# Patient Record
Sex: Female | Born: 1950 | Race: White | Hispanic: No | Smoking: Never smoker
Health system: Southern US, Community
[De-identification: ages and names within clinical notes are randomized; demographics above are authoritative.]

## PROBLEM LIST (undated history)

## (undated) DIAGNOSIS — I5032 Chronic diastolic (congestive) heart failure: Secondary | ICD-10-CM

## (undated) DIAGNOSIS — I10 Essential (primary) hypertension: Secondary | ICD-10-CM

## (undated) DIAGNOSIS — K589 Irritable bowel syndrome without diarrhea: Secondary | ICD-10-CM

## (undated) DIAGNOSIS — E7800 Pure hypercholesterolemia, unspecified: Secondary | ICD-10-CM

## (undated) DIAGNOSIS — F329 Major depressive disorder, single episode, unspecified: Secondary | ICD-10-CM

## (undated) DIAGNOSIS — M1990 Unspecified osteoarthritis, unspecified site: Secondary | ICD-10-CM

## (undated) DIAGNOSIS — N950 Postmenopausal bleeding: Secondary | ICD-10-CM

## (undated) DIAGNOSIS — H35033 Hypertensive retinopathy, bilateral: Secondary | ICD-10-CM

## (undated) DIAGNOSIS — E119 Type 2 diabetes mellitus without complications: Secondary | ICD-10-CM

## (undated) DIAGNOSIS — R9389 Abnormal findings on diagnostic imaging of other specified body structures: Secondary | ICD-10-CM

## (undated) DIAGNOSIS — N186 End stage renal disease: Secondary | ICD-10-CM

## (undated) DIAGNOSIS — I639 Cerebral infarction, unspecified: Secondary | ICD-10-CM

## (undated) DIAGNOSIS — N184 Chronic kidney disease, stage 4 (severe): Secondary | ICD-10-CM

## (undated) DIAGNOSIS — G629 Polyneuropathy, unspecified: Secondary | ICD-10-CM

## (undated) DIAGNOSIS — R569 Unspecified convulsions: Secondary | ICD-10-CM

## (undated) DIAGNOSIS — M797 Fibromyalgia: Secondary | ICD-10-CM

## (undated) DIAGNOSIS — Z87442 Personal history of urinary calculi: Secondary | ICD-10-CM

## (undated) DIAGNOSIS — D500 Iron deficiency anemia secondary to blood loss (chronic): Secondary | ICD-10-CM

## (undated) DIAGNOSIS — Z7901 Long term (current) use of anticoagulants: Secondary | ICD-10-CM

## (undated) DIAGNOSIS — G4733 Obstructive sleep apnea (adult) (pediatric): Secondary | ICD-10-CM

## (undated) HISTORY — PX: TUBAL LIGATION: SHX77

## (undated) HISTORY — PX: RECONSTRUCTION OF NOSE: SHX2301

---

## 1972-06-20 HISTORY — PX: RHINOPLASTY: SUR1284

## 1990-06-20 HISTORY — PX: WRIST FRACTURE SURGERY: SHX121

## 1998-03-14 ENCOUNTER — Observation Stay (HOSPITAL_COMMUNITY): Admission: EM | Admit: 1998-03-14 | Discharge: 1998-03-14 | Payer: Self-pay | Admitting: Emergency Medicine

## 1998-03-14 ENCOUNTER — Encounter: Payer: Self-pay | Admitting: Emergency Medicine

## 2001-03-07 ENCOUNTER — Encounter: Admission: RE | Admit: 2001-03-07 | Discharge: 2001-06-05 | Payer: Self-pay | Admitting: Family Medicine

## 2001-06-20 HISTORY — PX: LAPAROSCOPIC CHOLECYSTECTOMY: SUR755

## 2002-01-02 ENCOUNTER — Encounter: Admission: RE | Admit: 2002-01-02 | Discharge: 2002-04-02 | Payer: Self-pay | Admitting: Obstetrics and Gynecology

## 2002-03-29 ENCOUNTER — Encounter: Payer: Self-pay | Admitting: Emergency Medicine

## 2002-03-29 ENCOUNTER — Emergency Department (HOSPITAL_COMMUNITY): Admission: EM | Admit: 2002-03-29 | Discharge: 2002-03-29 | Payer: Self-pay | Admitting: Emergency Medicine

## 2002-04-18 ENCOUNTER — Ambulatory Visit (HOSPITAL_COMMUNITY): Admission: RE | Admit: 2002-04-18 | Discharge: 2002-04-19 | Payer: Self-pay | Admitting: General Surgery

## 2002-04-18 ENCOUNTER — Encounter: Payer: Self-pay | Admitting: General Surgery

## 2002-04-18 HISTORY — PX: LAPAROSCOPIC CHOLECYSTECTOMY: SUR755

## 2002-04-19 ENCOUNTER — Encounter (INDEPENDENT_AMBULATORY_CARE_PROVIDER_SITE_OTHER): Payer: Self-pay | Admitting: *Deleted

## 2003-12-01 ENCOUNTER — Inpatient Hospital Stay (HOSPITAL_BASED_OUTPATIENT_CLINIC_OR_DEPARTMENT_OTHER): Admission: RE | Admit: 2003-12-01 | Discharge: 2003-12-01 | Payer: Self-pay | Admitting: Cardiology

## 2003-12-01 HISTORY — PX: CARDIAC CATHETERIZATION: SHX172

## 2004-05-02 ENCOUNTER — Emergency Department (HOSPITAL_COMMUNITY): Admission: EM | Admit: 2004-05-02 | Discharge: 2004-05-03 | Payer: Self-pay | Admitting: Emergency Medicine

## 2004-06-16 ENCOUNTER — Emergency Department (HOSPITAL_COMMUNITY): Admission: EM | Admit: 2004-06-16 | Discharge: 2004-06-16 | Payer: Self-pay

## 2004-07-03 ENCOUNTER — Emergency Department (HOSPITAL_COMMUNITY): Admission: EM | Admit: 2004-07-03 | Discharge: 2004-07-03 | Payer: Self-pay | Admitting: Emergency Medicine

## 2005-06-09 ENCOUNTER — Emergency Department (HOSPITAL_COMMUNITY): Admission: EM | Admit: 2005-06-09 | Discharge: 2005-06-09 | Payer: Self-pay | Admitting: Emergency Medicine

## 2006-12-19 DIAGNOSIS — Z87898 Personal history of other specified conditions: Secondary | ICD-10-CM

## 2006-12-19 HISTORY — DX: Personal history of other specified conditions: Z87.898

## 2007-01-05 ENCOUNTER — Emergency Department (HOSPITAL_COMMUNITY): Admission: EM | Admit: 2007-01-05 | Discharge: 2007-01-05 | Payer: Self-pay | Admitting: Emergency Medicine

## 2007-11-28 ENCOUNTER — Emergency Department (HOSPITAL_COMMUNITY): Admission: EM | Admit: 2007-11-28 | Discharge: 2007-11-28 | Payer: Self-pay | Admitting: Emergency Medicine

## 2007-12-24 ENCOUNTER — Encounter: Admission: RE | Admit: 2007-12-24 | Discharge: 2007-12-24 | Payer: Self-pay | Admitting: Internal Medicine

## 2010-11-05 NOTE — H&P (Signed)
NAME:  Dorothy Harmon, Dorothy Harmon NO.:  [id number]   MEDICAL RECORD NO.:  [id number]                   PATIENT TYPE:  AMB   LOCATION:                                       FACILITY:  [facility]   PHYSICIAN:  Peter M. Martinique, M.D.               DATE OF BIRTH:  1950/10/03   DATE OF ADMISSION:  12/01/2003  DATE OF DISCHARGE:                                HISTORY & PHYSICAL   HISTORY OF PRESENT ILLNESS:  Dorothy Harmon is a pleasant 60 year old white  female with a history of morbid obesity, diabetes mellitus, hypertension,  hyperlipidemia, and family history of coronary disease who is seen for  evaluation of progressive dyspnea and chest pain.  The patient states she  has a very sedentary lifestyle.  However, recently she has developed  progressive symptoms of shortness of breath with any activity.  She states  she has to sit down and gasp for breath.  She has noticed it more since she  has returned to work part time in [employer].  She states her whole  anterior chest feels like it is squeezing and heavy and has some associated  palpitations.  She states she is always tired and has no energy.  She has  noticed some increased swelling in her lower extremities.  She has to sleep  sitting up in a chair.  She does carry diagnosis of sleep apnea. She states  she does not use CPAP therapy because it just did not seem to help.  To  further evaluate her symptoms, she underwent an adenosine Cardiolite study  on December 21, 2003.  This was a nondiagnostic study.  It demonstrated a  moderate fixed defect involving the anterior wall.  In the vertical axis  views there was a reversible defect at the apex.  A lot of the anterior wall  defect was felt to be related to breast attenuation artifact but ischemia  could not be excluded and/or scar.  Because of her progressive symptoms,  multiple cardiac risk factors, and nondiagnostic, noninvasive evaluation, it  was recommended that she undergo  cardiac catheterization.   PAST MEDICAL HISTORY:  1. Morbid obesity.  2. Obstructive sleep apnea.  3. Hypertension.  4. Hyperlipidemia.  5. Diabetes mellitus, insulin requiring.  6. Irritable bowel syndrome.  7. Fibromyalgia.  8. Status post cholecystectomy.  9. Status post tubal ligation.   ALLERGIES:  1. PENICILLIN.  2. CODEINE.   CURRENT MEDICATIONS:  1. Lantus insulin 15 units q.h.s.  2. Amaryl 4 mg per day.  3. Aspirin 325 mg per day.  4. She is also on the following medications at unknown doses:  Avapro daily,     Actos daily, Lipitor daily, and Norvasc daily.  5. She takes a fish oil and vitamin B supplement daily.   SOCIAL HISTORY:  The patient works part time in a [employer].  She does  not exercise.  She denies tobacco or alcohol use.  She is divorced and has  two children.   FAMILY HISTORY:  Father died at age 60 of myocardial infarction.  Mother is  age 2 and has diabetes.  She has no siblings.   REVIEW OF SYMPTOMS:  As noted in HPI.  She has no recent change in bowel  habits or kidney function.  No history of renal disease.  No history of  retinopathy.  No history of TIA, CVA or claudication.  Other review of  systems are negative.   PHYSICAL EXAMINATION:  GENERAL APPEARANCE:  The patient is a morbidly obese  white female in no distress.  VITAL SIGNS:  Weight 309, blood pressure 140/88, resting pulse 100,  respirations are normal.  HEENT:  Pupils are equal, round, reactive to light and accommodation.  Extraocular movements are intact.  Oropharynx is clear.  NECK:  Thick without JVD, adenopathy or bruits.  LUNGS:  Clear to auscultation and percussion.  BREASTS:  Large and pendulous.  CARDIOVASCULAR:  Regular rate and rhythm without gallops, murmurs, rubs or  clicks.  ABDOMEN:  Morbidly obese, soft and nontender.  There are no masses or  bruits.  EXTREMITIES:  There is 1 to 2+ edema to the mid calf.  There is no cyanosis,  pulses 2+ and symmetric.   NEUROLOGIC:  Nonfocal.   LABORATORY DATA:  ECG shows normal sinus rhythm with a normal ECG.   Chest x-ray is poorly penetrated.  There is question of a left lower lobe  infiltrate versus atelectasis.  Coags are normal.  Glucose 107, BUN 17,  creatinine 0.7, sodium 142, potassium 4.1, chloride 104, CO2 29, white count  5900, hemoglobin 13.6, platelets 296,000. BNP level was 22.   IMPRESSION:  1. Symptoms of progressive dyspnea, chest heaviness and tightness.     Noninvasive evaluation with adenosine Cardiolite study that is     nondiagnostic.  The patient has multiple cardiac risk factors.  Need to     consider ischemic heart disease.  Differential also includes pulmonary     hypertension because of her morbid obesity and history of sleep apnea.  2. Diabetes mellitus, insulin requiring.  3. Hypertension.  4. Hyperlipidemia.  5. Morbid obesity.   PLAN:  Will proceed with right and left heart catheterization coronary  angiography.                                                Peter M. Martinique, M.D.    PMJ/MEDQ  D:  11/27/2003  T:  11/28/2003  Job:  [id number]   cc:   Haywood Pao, M.D.  [address]  [facility]  [place] [address]  Fax: [phone]

## 2010-11-05 NOTE — Op Note (Signed)
NAME:  Dorothy Harmon, Dorothy Harmon NO.:  [id number]   MEDICAL RECORD NO.:  [id number]                   PATIENT TYPE:  OIB   LOCATION:  5705                                 FACILITY:  [facility]   PHYSICIAN:  Odis Hollingshead, M.D.            DATE OF BIRTH:  [birth year]   DATE OF PROCEDURE:  04/18/2002  DATE OF DISCHARGE:                                 OPERATIVE REPORT   PREOPERATIVE DIAGNOSIS:  Symptomatic cholelithiasis.   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   PROCEDURE:  Laparoscopic cholecystectomy with intraoperative cholangiogram.   SURGEON:  Odis Hollingshead, M.D.   ASSISTANT:  Haywood Lasso, M.D.   ANESTHESIA:  General.   INDICATIONS:  The patient is a 60 year old female who has type 2 diabetes.  She had a severe case of biliary colic.  She presented to the [facility]  emergency department and was discovered to have a 2.1 cm gallstone with no  biliary ductal dilatation, no evidence of acute cholecystitis.  She was seen  in the office and now presents for elective laparoscopic cholecystectomy.  The procedure and the risks were discussed with her preoperatively.   DESCRIPTION OF PROCEDURE:  She was brought to the operating room, placed  supine on the operating table, and a general anesthetic was administered.  Her abdomen was sterilely prepped and draped.  Dilute Marcaine was  infiltrated in the skin of the subumbilical region and a small subumbilical  incision was made through the skin and subcutaneous tissue.  The midline  fascia was identified and a small incision made in the midline fascia.  A  pursestring suture of 0 Vicryl was placed around the fascial edges.  The  peritoneal cavity was then entered bluntly under direct vision.  A Hasson  trocar was introduced into the peritoneal cavity, and pneumoperitoneum was  created by insufflation of CO2 gas.  Next she was placed in the  reverse  Trendelenburg position and rotated slightly right-side-up.  The laparoscope  was introduced and some fatty infiltration of the liver was noted.  Under  direct vision, an 11 mm trocar was placed through a similar-sized epigastric  incision into the peritoneal cavity and two 5 mm trocars were placed in the  right midabdomen through small incisions into the peritoneal cavity.  The  fundus of the gallbladder was then grasped and retracted toward the right  shoulder.  The infundibulum was then grasped and using blunt dissection and  a select cautery and staying close to the gallbladder, the infundibulum was  mobilized.  The cystic duct was identified and a window created around it.  A clip was placed at the cystic duct-gallbladder junction and a small  incision made in the cystic duct.  A cholangiocatheter was passed through  the anterior abdominal wall, and a cholangiogram was then performed.   Under real-time fluoroscopy, dilute contrast  material was injected to the  cystic duct.  The cystic duct was of moderate length.  The common hepatic,  right and left hepatic, and common bile ducts all filled promptly and  contrast splashed into the duodenum promptly.  There was no obvious evidence  of obstructive lesions.  The final report is pending the radiologist's  interpretation.   The cholangiocatheter was removed.  The cystic duct was then clipped three  times proximally and divided.  The cystic duct was identified, clipped, and  divided.  The gallbladder was dissected free from the liver bed intact with  the cautery.  It was placed in an Endopouch bag.   The gallbladder fossa was irrigated, inspected, and bleeding was controlled  with the cautery.  When hemostasis was adequate, the area was inspected  again and no bleeding or bile leak was noted.  The irrigation fluid was then  evacuated.   The gallbladder was then removed in the bag through the subumbilical port.  Under direct  laparoscopic vision, the fascial defect was closed by  tightening up and tying down the pursestring suture.  The remaining trocars  were removed and the pneumoperitoneum was released.  The skin incisions were  closed with 4-0 Monocryl subcuticular stitches.  Steri-Strips and sterile  dressings were applied.   She tolerated the procedure well without any apparent complications and was  taken to the recovery room in satisfactory condition.                                               Odis Hollingshead, M.D.    Katina Degree  D:  04/18/2002  T:  04/19/2002  Job:  [id number]   cc:   Haywood Pao, M.D.

## 2010-11-05 NOTE — Cardiovascular Report (Signed)
NAME:  Dorothy Harmon, Dorothy Harmon NO.:  [id number]   MEDICAL RECORD NO.:  [id number]                   PATIENT TYPE:  OIB   LOCATION:  6501                                 FACILITY:  [facility]   PHYSICIAN:  Peter M. Martinique, M.D.               DATE OF BIRTH:  30-Apr-1951   DATE OF PROCEDURE:  12/01/2003  DATE OF DISCHARGE:  12/01/2003                              CARDIAC CATHETERIZATION   </PROCEDURES>  Right and left heart catheterization, coronary and left ventricular  angiography.   CARDIOLOGIST:  Peter M. Martinique, M.D.   INDICATIONS:  This is a 60 year old white female with a history of morbid  obesity, diabetes mellitus, hypertension, hyperlipidemia, who presents with  symptoms of chest discomfort and shortness of breath on exertion.  Previous  Cardiolite study was nondiagnostic and this demonstrated predominantly fixed  anterior wall defect with some mild reversibility of the apex.   ACCESS:  Via the right femoral artery using the standard Seldinger  technique.   EQUIPMENT:  4 French 4 cm right and left Judkins catheter, 4 French pigtail  catheter, 4 French arterial sheath, 7 French venous sheath and a balloon tip  Swan-Ganz catheter.   CONTRAST:  19mL of Omnipaque.   MEDICATIONS:  Local anesthesia with 1% Xylocaine.   COMPLICATIONS:  None.   HEMODYNAMIC DATA:  Aortic pressure is 136/80 with a mean of 105.  Left  ventricular pressure is 143 with an end-diastolic pressure of 25 mmHg.  Right atrial pressure is 19/13 with a mean of 12 mm of mercury.  Right  ventricular pressure is 42 with an end-diastolic pressure of 16 mmHg.  Pulmonary artery pressure is 41/22 with a mean of 32 mmHg.  Pulmonary  capillary wedge pressure is 24/23 with a mean of 19 mmHg.  Cardiac output by  Fick was 8.7 with an index of 3.7.  There is no evidence of shunt.   ANGIOGRAPHIC DATA:  Left ventricular angiography was performed in the RAO  view.  This demonstrates normal left  ventricular size and contractility with  normal systolic function.  Ejection fraction is estimated at 65%.  There is  no mitral regurgitation or prolapse.   CORONARY ANGIOGRAPHY:  The left coronary artery arises and distributes  normally.  The left main coronary artery is normal.   The left anterior descending artery and its branches are normal.   The left circumflex coronary artery is normal.   The right coronary artery is a large dominant vessel and is normal.   FINAL INTERPRETATION:  1. Normal coronary anatomy.  2. Normal left ventricular function.  3. Mild to moderate pulmonary hypertension.   PLAN:  Recommend continued medical therapy with specific treatment  addressing her sleep apnea and morbid obesity.  Peter M. Martinique, M.D.    PMJ/MEDQ  D:  12/01/2003  T:  12/02/2003  Job:  [id number]   cc:   Haywood Pao, M.D.  [address]  [place]  [place] [address]  Fax: [phone]

## 2011-03-17 LAB — URINE CULTURE

## 2011-03-17 LAB — POCT I-STAT, CHEM 8
BUN: 12
Calcium, Ion: 1.11 — ABNORMAL LOW
Chloride: 105
Creatinine, Ser: 0.9
Glucose, Bld: 187 — ABNORMAL HIGH
HCT: 42
Hemoglobin: 14.3
Potassium: 4
Sodium: 139
TCO2: 28

## 2011-03-17 LAB — HEPATIC FUNCTION PANEL
AST: 16
Albumin: 3.4 — ABNORMAL LOW

## 2011-03-17 LAB — POCT CARDIAC MARKERS
CKMB, poc: <1 — ABNORMAL LOW
Myoglobin, poc: 33.4
Operator id: 277751
Troponin i, poc: <0.05

## 2011-03-17 LAB — URINALYSIS, ROUTINE W REFLEX MICROSCOPIC
Glucose, UA: NEGATIVE
Hgb urine dipstick: NEGATIVE
Specific Gravity, Urine: 1.01
pH: 6

## 2011-03-17 LAB — CBC
MCHC: 34.7
RBC: 4.74

## 2011-03-17 LAB — DIFFERENTIAL
Basophils Absolute: 0
Basophils Relative: 1
Monocytes Relative: 8
Neutro Abs: 3.3
Neutrophils Relative %: 54

## 2011-03-17 LAB — URINE MICROSCOPIC-ADD ON

## 2011-04-04 LAB — URINALYSIS, ROUTINE W REFLEX MICROSCOPIC
Bilirubin Urine: NEGATIVE
Hgb urine dipstick: NEGATIVE
Specific Gravity, Urine: 1.023
pH: 7

## 2011-04-04 LAB — I-STAT 8, (EC8 V) (CONVERTED LAB)
Acid-base deficit: 2
BUN: 17
Bicarbonate: 21.8
Chloride: 109
HCT: 46
Hemoglobin: 15.6 — ABNORMAL HIGH
Operator id: 189501
Sodium: 140

## 2011-04-04 LAB — URINE MICROSCOPIC-ADD ON

## 2011-04-04 LAB — COMPREHENSIVE METABOLIC PANEL
Albumin: 3.7
BUN: 15
Calcium: 8.7
Creatinine, Ser: 0.84
Potassium: 3.4 — ABNORMAL LOW
Total Protein: 6.6

## 2011-04-04 LAB — URINE CULTURE

## 2011-04-04 LAB — CBC
HCT: 42.8
MCHC: 34.5
Platelets: 225
RDW: 13.7

## 2011-04-04 LAB — DIFFERENTIAL
Lymphocytes Relative: 6 — ABNORMAL LOW
Lymphs Abs: 0.8
Monocytes Absolute: 0.6
Monocytes Relative: 4
Neutro Abs: 12.4 — ABNORMAL HIGH
Neutrophils Relative %: 89 — ABNORMAL HIGH

## 2011-04-04 LAB — POCT I-STAT CREATININE: Creatinine, Ser: 1

## 2011-04-04 LAB — RAPID URINE DRUG SCREEN, HOSP PERFORMED
Benzodiazepines: NOT DETECTED
Cocaine: NOT DETECTED
Opiates: NOT DETECTED
Tetrahydrocannabinol: NOT DETECTED

## 2013-04-21 ENCOUNTER — Observation Stay (HOSPITAL_COMMUNITY): Payer: Self-pay

## 2013-04-21 ENCOUNTER — Inpatient Hospital Stay (HOSPITAL_COMMUNITY)
Admission: EM | Admit: 2013-04-21 | Discharge: 2013-04-24 | DRG: 065 | Disposition: A | Discharge status: Home / Self Care | Payer: Self-pay | Attending: Internal Medicine | Admitting: Internal Medicine

## 2013-04-21 ENCOUNTER — Emergency Department (HOSPITAL_COMMUNITY): Payer: Self-pay

## 2013-04-21 ENCOUNTER — Encounter (HOSPITAL_COMMUNITY): Payer: Self-pay | Admitting: Radiology

## 2013-04-21 DIAGNOSIS — R569 Unspecified convulsions: Secondary | ICD-10-CM

## 2013-04-21 DIAGNOSIS — R4789 Other speech disturbances: Secondary | ICD-10-CM

## 2013-04-21 DIAGNOSIS — E1165 Type 2 diabetes mellitus with hyperglycemia: Secondary | ICD-10-CM | POA: Diagnosis present

## 2013-04-21 DIAGNOSIS — H5316 Psychophysical visual disturbances: Secondary | ICD-10-CM | POA: Diagnosis present

## 2013-04-21 DIAGNOSIS — Z8249 Family history of ischemic heart disease and other diseases of the circulatory system: Secondary | ICD-10-CM

## 2013-04-21 DIAGNOSIS — Z8673 Personal history of transient ischemic attack (TIA), and cerebral infarction without residual deficits: Secondary | ICD-10-CM

## 2013-04-21 DIAGNOSIS — I639 Cerebral infarction, unspecified: Secondary | ICD-10-CM | POA: Diagnosis present

## 2013-04-21 DIAGNOSIS — Z880 Allergy status to penicillin: Secondary | ICD-10-CM

## 2013-04-21 DIAGNOSIS — K589 Irritable bowel syndrome without diarrhea: Secondary | ICD-10-CM | POA: Diagnosis present

## 2013-04-21 DIAGNOSIS — E119 Type 2 diabetes mellitus without complications: Secondary | ICD-10-CM

## 2013-04-21 DIAGNOSIS — R413 Other amnesia: Secondary | ICD-10-CM | POA: Diagnosis present

## 2013-04-21 DIAGNOSIS — I6350 Cerebral infarction due to unspecified occlusion or stenosis of unspecified cerebral artery: Principal | ICD-10-CM | POA: Diagnosis present

## 2013-04-21 DIAGNOSIS — Z9089 Acquired absence of other organs: Secondary | ICD-10-CM

## 2013-04-21 DIAGNOSIS — F05 Delirium due to known physiological condition: Secondary | ICD-10-CM | POA: Diagnosis present

## 2013-04-21 DIAGNOSIS — I10 Essential (primary) hypertension: Secondary | ICD-10-CM | POA: Diagnosis present

## 2013-04-21 DIAGNOSIS — G459 Transient cerebral ischemic attack, unspecified: Secondary | ICD-10-CM

## 2013-04-21 DIAGNOSIS — E7800 Pure hypercholesterolemia, unspecified: Secondary | ICD-10-CM | POA: Diagnosis present

## 2013-04-21 DIAGNOSIS — E785 Hyperlipidemia, unspecified: Secondary | ICD-10-CM | POA: Diagnosis present

## 2013-04-21 DIAGNOSIS — Z833 Family history of diabetes mellitus: Secondary | ICD-10-CM

## 2013-04-21 DIAGNOSIS — H539 Unspecified visual disturbance: Secondary | ICD-10-CM

## 2013-04-21 DIAGNOSIS — IMO0001 Reserved for inherently not codable concepts without codable children: Secondary | ICD-10-CM | POA: Diagnosis present

## 2013-04-21 DIAGNOSIS — IMO0002 Reserved for concepts with insufficient information to code with codable children: Secondary | ICD-10-CM | POA: Diagnosis present

## 2013-04-21 HISTORY — DX: Pure hypercholesterolemia, unspecified: E78.00

## 2013-04-21 HISTORY — DX: Unspecified osteoarthritis, unspecified site: M19.90

## 2013-04-21 HISTORY — DX: Unspecified convulsions: R56.9

## 2013-04-21 HISTORY — DX: Type 2 diabetes mellitus without complications: E11.9

## 2013-04-21 HISTORY — DX: Fibromyalgia: M79.7

## 2013-04-21 HISTORY — DX: Personal history of transient ischemic attack (TIA), and cerebral infarction without residual deficits: Z86.73

## 2013-04-21 HISTORY — DX: Essential (primary) hypertension: I10

## 2013-04-21 LAB — CBC
Hemoglobin: 14.5 g/dL (ref 12.0–15.0)
MCH: 30.5 pg (ref 26.0–34.0)
Platelets: 231 10*3/uL (ref 150–400)
RBC: 4.76 MIL/uL (ref 3.87–5.11)
WBC: 6 10*3/uL (ref 4.0–10.5)

## 2013-04-21 LAB — POCT I-STAT TROPONIN I: Troponin i, poc: 0.01 ng/mL (ref 0.00–0.08)

## 2013-04-21 LAB — DIFFERENTIAL
Eosinophils Absolute: 0.2 10*3/uL (ref 0.0–0.7)
Lymphocytes Relative: 34 % (ref 12–46)
Lymphs Abs: 2.1 10*3/uL (ref 0.7–4.0)
Monocytes Relative: 5 % (ref 3–12)
Neutro Abs: 3.4 10*3/uL (ref 1.7–7.7)
Neutrophils Relative %: 57 % (ref 43–77)

## 2013-04-21 LAB — URINALYSIS, ROUTINE W REFLEX MICROSCOPIC
Bilirubin Urine: NEGATIVE
Glucose, UA: >1000 mg/dL — AB
Hgb urine dipstick: NEGATIVE
Ketones, ur: NEGATIVE mg/dL
Leukocytes, UA: NEGATIVE
Nitrite: NEGATIVE
Protein, ur: NEGATIVE mg/dL
Specific Gravity, Urine: 1.035 — ABNORMAL HIGH (ref 1.005–1.030)
Urobilinogen, UA: 0.2 mg/dL (ref 0.0–1.0)
pH: 5 (ref 5.0–8.0)

## 2013-04-21 LAB — COMPREHENSIVE METABOLIC PANEL
ALT: 12 U/L (ref 0–35)
Alkaline Phosphatase: 99 U/L (ref 39–117)
BUN: 21 mg/dL (ref 6–23)
CO2: 22 mEq/L (ref 19–32)
Calcium: 9 mg/dL (ref 8.4–10.5)
Chloride: 98 mEq/L (ref 96–112)
GFR calc Af Amer: >90 mL/min (ref >90)
GFR calc non Af Amer: 89 mL/min — ABNORMAL LOW (ref >90)
Glucose, Bld: 517 mg/dL — ABNORMAL HIGH (ref 70–99)
Potassium: 3.8 mEq/L (ref 3.5–5.1)
Sodium: 134 mEq/L — ABNORMAL LOW (ref 135–145)
Total Bilirubin: 0.2 mg/dL — ABNORMAL LOW (ref 0.3–1.2)
Total Protein: 6.7 g/dL (ref 6.0–8.3)

## 2013-04-21 LAB — PROTIME-INR
INR: 0.87 (ref 0.00–1.49)
Prothrombin Time: 11.7 seconds (ref 11.6–15.2)

## 2013-04-21 LAB — POCT I-STAT, CHEM 8
BUN: 23 mg/dL (ref 6–23)
Calcium, Ion: 1.16 mmol/L (ref 1.13–1.30)
Chloride: 101 mEq/L (ref 96–112)
Creatinine, Ser: 0.9 mg/dL (ref 0.50–1.10)
Glucose, Bld: 517 mg/dL — ABNORMAL HIGH (ref 70–99)
HCT: 43 % (ref 36.0–46.0)
Hemoglobin: 14.6 g/dL (ref 12.0–15.0)
Potassium: 3.8 mEq/L (ref 3.5–5.1)
Sodium: 138 mEq/L (ref 135–145)
TCO2: 22 mmol/L (ref 0–100)

## 2013-04-21 LAB — TROPONIN I: Troponin I: <0.3 ng/mL (ref <0.30)

## 2013-04-21 LAB — GLUCOSE, CAPILLARY
Glucose-Capillary: 448 mg/dL — ABNORMAL HIGH (ref 70–99)
Glucose-Capillary: 393 mg/dL — ABNORMAL HIGH (ref 70–99)
Glucose-Capillary: 393 mg/dL — ABNORMAL HIGH (ref 70–99)
Glucose-Capillary: 406 mg/dL — ABNORMAL HIGH (ref 70–99)

## 2013-04-21 LAB — URINE MICROSCOPIC-ADD ON

## 2013-04-21 LAB — APTT: aPTT: 26 seconds (ref 24–37)

## 2013-04-21 MED ORDER — SODIUM CHLORIDE 0.9 % IV BOLUS (SEPSIS)
1000.0000 mL | Freq: Once | INTRAVENOUS | Status: AC
  Administered 2013-04-21: 1000 mL via INTRAVENOUS

## 2013-04-21 MED ORDER — ENOXAPARIN SODIUM 40 MG/0.4ML ~~LOC~~ SOLN
40.0000 mg | SUBCUTANEOUS | Status: DC
  Administered 2013-04-24: 40 mg via SUBCUTANEOUS
  Filled 2013-04-21 (×3): qty 0.4

## 2013-04-21 MED ORDER — INSULIN ASPART 100 UNIT/ML ~~LOC~~ SOLN
0.0000 [IU] | Freq: Three times a day (TID) | SUBCUTANEOUS | Status: DC
  Administered 2013-04-22 – 2013-04-23 (×4): 5 [IU] via SUBCUTANEOUS
  Administered 2013-04-23: 8 [IU] via SUBCUTANEOUS
  Administered 2013-04-23: 3 [IU] via SUBCUTANEOUS
  Administered 2013-04-24: 8 [IU] via SUBCUTANEOUS

## 2013-04-21 MED ORDER — SODIUM CHLORIDE 0.9 % IV SOLN
1000.0000 mg | Freq: Once | INTRAVENOUS | Status: AC
  Administered 2013-04-21: 1000 mg via INTRAVENOUS
  Filled 2013-04-21: qty 10

## 2013-04-21 MED ORDER — INSULIN GLARGINE 100 UNIT/ML ~~LOC~~ SOLN
5.0000 [IU] | Freq: Once | SUBCUTANEOUS | Status: AC
  Administered 2013-04-21: 5 [IU] via SUBCUTANEOUS
  Filled 2013-04-21 (×2): qty 0.05

## 2013-04-21 MED ORDER — INSULIN ASPART 100 UNIT/ML ~~LOC~~ SOLN
10.0000 [IU] | Freq: Once | SUBCUTANEOUS | Status: AC
  Administered 2013-04-21: 10 [IU] via SUBCUTANEOUS

## 2013-04-21 MED ORDER — SODIUM CHLORIDE 0.9 % IV BOLUS (SEPSIS): 1000.0000 mL | Freq: Once | INTRAVENOUS | Status: DC

## 2013-04-21 MED ORDER — SODIUM CHLORIDE 0.9 % IV SOLN
INTRAVENOUS | Status: DC
  Administered 2013-04-22: 23:00:00 via INTRAVENOUS

## 2013-04-21 NOTE — ED Notes (Signed)
Phebotomy collected blood work.

## 2013-04-21 NOTE — ED Notes (Signed)
CBG 448. 

## 2013-04-21 NOTE — ED Notes (Signed)
Pt arrived at triage and reports difficulty with speech and "getting words" out that started while she was sitting in her car at a friends house at 3:30pm.  States she could hear dogs barking in the neighborhood but could tell that something was not right.  Reports that some symptoms have improved but that she still does not know some words and does not know all of the numbers.  Code Stroke called and pt straight to bridge for EDP to assess airway.

## 2013-04-21 NOTE — ED Notes (Signed)
CBG checked 393 RN notified.

## 2013-04-21 NOTE — ED Notes (Signed)
Patient called son at 58 , said she felt like something was happening. Seemed confused and speech slurred. Son did not notice a facial droop. Pt had difficulty recalling certain words. LSN: 1530.

## 2013-04-21 NOTE — ED Notes (Signed)
Neurologist assessed.

## 2013-04-21 NOTE — ED Notes (Signed)
Pt insisted on getting out of bed to sit on bedpan on chair.  Able to get up without any difficulty, MAE well, strength good.  Pt stated she feels like her normal self.  Internal medicine in room for admission.

## 2013-04-21 NOTE — H&P (Signed)
Date: 04/21/2013               Patient Name:  Dorothy Harmon MRN: [id number]  DOB: Sep 26, 1950 Age / Sex: 62 y.o., female   PCP: Haywood Pao, MD         Medical Service: Internal Medicine Teaching Service         Attending Physician: Dr. Murlean Caller    First Contact: Dr. Stann Mainland Pager: [phone]  Second Contact: Dr. Aundra Dubin Pager: [phone]       After Hours (After 5p/  First Contact Pager: [phone]  weekends / holidays): Second Contact Pager: [phone]   Chief Complaint: Visual and speech distrubance  History of Present Illness: [name] is a 62 year old female with a PMH of DMT2 uncontrolled, HTN, HLD, Arthritis, Fibromyalgia, and IBS.  She reports she had been in her normal state of health today and drove over to see a friend around 3pm.  She reports after she parked she felt odd and closed the car door and sat in the car.  She reports having visual disturbances similar to vivid dreams, speaking abnormal, and laughing inappropriately. She is not sure exactly how long this occurred for but believes it lasted only a few minutes (maybe 5 minutes).  She subsequently went inside her friends house and stayed there an hour and a half.  Her friend then drove her home, and after she relayed the story to her son he had her come to the ED to be checked out. She reports she has not always been great about taking her DM and HTN medications and she is not sure of the names of the medications.  She does report that she gets them filled at the [facility] near [facility], and that she last saw her PCP 2 months ago, at which time she was told her DM was uncontrolled.   Meds: Current Facility-Administered Medications  Medication Dose Route Frequency Provider Last Rate Last Dose  . levETIRAcetam (KEPPRA) 1,000 mg in sodium chloride 0.9 % 100 mL IVPB  1,000 mg Intravenous Once Roland Rack, MD       No current outpatient prescriptions on file.    Allergies: Allergies as of 04/21/2013 - Review Complete  04/21/2013  Allergen Reaction Noted  . Morphine and related  04/21/2013  . Penicillins  04/21/2013   Past Medical History  Diagnosis Date  . Diabetes mellitus without complication [id number]  . Hypertension   . Hypercholesteremia   . Fibromyalgia   . Arthritis    Past Surgical History  Procedure Laterality Date  . Laparoscopic cholecystectomy  2003    Dr. Zella Richer; Chronic calculous cholecystitis and fatty infiltration of the liver  . Reconstruction of nose      cosmetic   Family History  Problem Relation Age of Onset  . Heart attack Father   . Diabetes Mother     alive, 29yo (56)  . Hypertension Mother    History   Social History  . Marital Status: Legally Separated    Spouse Name: N/A    Number of Children: 2  . Years of Education: N/A   Occupational History  . customer service     [employer]   Social History Main Topics  . Smoking status: Never Smoker   . Smokeless tobacco: Not on file  . Alcohol Use: No  . Drug Use: No  . Sexual Activity: Not on file   Other Topics Concern  . Not on file   Social History Narrative  Mother lives with her; divorced    Review of Systems: Review of Systems  Constitutional: Negative for fever, chills, weight loss and malaise/fatigue.  HENT: Negative for sore throat.   Eyes: Positive for double vision. Negative for blurred vision, photophobia and discharge.  Respiratory: Negative for cough, sputum production and shortness of breath.   Cardiovascular: Negative for chest pain, palpitations and leg swelling.  Gastrointestinal: Negative for nausea, vomiting, abdominal pain and diarrhea.  Genitourinary: Negative for dysuria, urgency, frequency and hematuria.  Musculoskeletal: Negative for falls and myalgias.  Skin: Negative for rash.  Neurological: Positive for speech change. Negative for dizziness, tingling, sensory change, focal weakness, seizures, loss of consciousness, weakness and headaches.  Endo/Heme/Allergies:  Positive for polydipsia.  Psychiatric/Behavioral: Negative for substance abuse.     Physical Exam: Blood pressure 139/118, pulse 35, temperature 98 F (36.7 C), temperature source Oral, resp. rate 13, height 5\' 4"  (1.626 m), weight 199 lb 4.7 oz (90.4 kg), SpO2 100.00%. Physical Exam  Nursing note and vitals reviewed. Constitutional: She is well-developed, well-nourished, and in no distress. No distress.  HENT:  Head: Normocephalic and atraumatic.  Eyes: EOM are normal. Pupils are equal, round, and reactive to light.  Neck: Normal range of motion.  Cardiovascular: Normal rate, regular rhythm, normal heart sounds and intact distal pulses.   No murmur heard. Pulmonary/Chest: Effort normal and breath sounds normal. No respiratory distress. She has no wheezes. She has no rales.  Abdominal: Soft. Bowel sounds are normal. She exhibits no distension. There is no tenderness. There is no rebound.  Musculoskeletal: Normal range of motion. She exhibits no edema.  Neurological: She is alert. She has normal strength. She displays no weakness, facial symmetry and normal reflexes. No cranial nerve deficit. She has a normal Cerebellar Exam, a normal Finger-Nose-Finger Test, a normal Heel to [employer] and a normal Romberg Test. She shows no pronator drift. Coordination and gait normal. She displays no Babinski's sign on the right side. She displays no Babinski's sign on the left side.  Reflex Scores:      Tricep reflexes are 2+ on the right side and 2+ on the left side.      Bicep reflexes are 2+ on the right side and 2+ on the left side.      Patellar reflexes are 2+ on the right side and 2+ on the left side. Skin: She is not diaphoretic.     Lab results: Basic Metabolic Panel:  Recent Labs  04/21/13 1754 04/21/13 1821  NA 134* 138  K 3.8 3.8  CL 98 101  CO2 22  --   GLUCOSE 517* 517*  BUN 21 23  CREATININE 0.74 0.90  CALCIUM 9.0  --   AG: 14 Liver Function Tests:  Recent Labs   04/21/13 1754  AST 14  ALT 12  ALKPHOS 99  BILITOT 0.2*  PROT 6.7  ALBUMIN 3.4*   CBC:  Recent Labs  04/21/13 1754 04/21/13 1821  WBC 6.0  --   NEUTROABS 3.4  --   HGB 14.5 14.6  HCT 40.7 43.0  MCV 85.5  --   PLT 231  --    Cardiac Enzymes:  Recent Labs  04/21/13 1754  TROPONINI <0.30   CBG:  Recent Labs  04/21/13 1826 04/21/13 2112  GLUCAP 448* 393*   Coagulation:  Recent Labs  04/21/13 1754  LABPROT 11.7  INR 0.87   Urine Drug Screen: Drugs of Abuse     Component Value Date/Time   LABOPIA NONE  DETECTED 01/05/2007 1628   COCAINSCRNUR NONE DETECTED 01/05/2007 1628   LABBENZ NONE DETECTED 01/05/2007 1628   AMPHETMU NONE DETECTED 01/05/2007 1628   THCU NONE DETECTED 01/05/2007 1628   LABBARB  Value: NONE DETECTED        DRUG SCREEN FOR MEDICAL PURPOSES ONLY.  IF CONFIRMATION IS NEEDED FOR ANY PURPOSE, NOTIFY LAB WITHIN 5 DAYS. 01/05/2007 1628     Imaging results:  Ct Head (brain) Wo Contrast  04/21/2013   CLINICAL DATA:  Acute stroke.  EXAM: CT HEAD WITHOUT CONTRAST  TECHNIQUE: Contiguous axial images were obtained from the base of the skull through the vertex without intravenous contrast.  COMPARISON:  11/28/2007  FINDINGS: There is no evidence of intracranial hemorrhage, brain edema, or other signs of acute infarction. There is no evidence of intracranial mass lesion or mass effect. No abnormal extraaxial fluid collections are identified.  Old infarct again seen involving the right posterior inferior cerebellum. Ventricles are normal in size. No other intracranial abnormality identified. No skull abnormality visualized.  IMPRESSION: No acute intracranial findings.  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   Electronically Signed   By: Earle Gell M.D.   On: 04/21/2013 18:07    Other results: EKG: NSR, rate 70bpm, normal axis, T wave inversion in lead III.  Assessment & Plan by Problem:    Speech and visual disturbance: DDx Stroke (head CT neg), TIA,  Seizure,  Migraine with aura, metabolic abnormalities.  Neurology consulted in the ED, favors Dx of partial seizure secondary to hyperglycemia.  Patient does have evidence on CT of old right cerebellar infarct, if these symptoms are from a TIA her ABCD2 score is: 4 indicating a moderate risk of progression to stroke. - Keppra  loading dose - MRI brain - Glycemic control - Neuro checks q2 - Telemetry - Swallow screen - EKG - Lipid panel, A1C -Troponin neg   Diabetes Mellitus Type 2, uncontrolled.  Hyperglycemic in the ED with blood glucose of 517.  Increased anion gap of 14. Bicarb within normal limits at 22. Unsure at this time of home medications.   - Check A1C - Check U/A, CXR - SSI-M, Lantus 5u QHS - IVF NS at 100cc/hr (has received 2L bolus in ED) - CBG AC qhs.  Hypertension -Avoid antihypertensives at this time in setting of possible stroke, try to get medication records from PCP/Pharmacy.  Hyperlipidemia -Lipid panel   DVT PPx: Lovenox Dispo: Disposition is deferred at this time, awaiting improvement of current medical problems. Anticipated discharge in approximately 1 day(s).   The patient does have a current PCP Haywood Pao, MD) and does not need an [facility] hospital follow-up appointment after discharge.  The patient does not have transportation limitations that hinder transportation to clinic appointments.  Signed: Joni Reining, DO 04/21/2013, 9:33 PM

## 2013-04-21 NOTE — Consult Note (Signed)
Neurology Consultation Reason for Consult: Abnormal vision changes Referring Physician: Wilson Singer, S  CC: Abnormal vision  History is obtained from: Patient, son  HPI: Dorothy Harmon is a 62 y.o. female who is unrelated as a friend, and when she arrived she had a visual change. She states that she saw the same image repeated multiple times and that this vision change lasted for several minutes. Once resolve she went in to speak with her friend who said that she was acting slightly confused. She has continued to act slightly "drunk."  On arrival to the emergency department, her symptoms are mostly resolved other than feeling still slightly odd.  Last known well-unclear as she was feeling slightly uneven prior to the episode of hallucination TPA given: No, unclear time of onset  ROS: A 14 point ROS was performed and is negative except as noted in the HPI.  Past Medical History  Diagnosis Date  . Diabetes mellitus without complication   . Hypertension   . Hypercholesteremia   . Fibromyalgia   . Arthritis     Family History: Mother to diabetes  Social History: Tob: Negative  Exam: Current vital signs: BP 165/67  Pulse 70  Temp(Src) 98 F (36.7 C) (Oral)  Resp 15  Ht 5\' 4"  (1.626 m)  Wt 90.4 kg (199 lb 4.7 oz)  BMI 34.19 kg/m2  SpO2 99% Vital signs in last 24 hours: Temp:  [97.9 F (36.6 C)-98 F (36.7 C)] 98 F (36.7 C) (11/02 1850) Pulse Rate:  [70-76] 70 (11/02 1930) Resp:  [12-20] 15 (11/02 1930) BP: (146-165)/(67-86) 165/67 mmHg (11/02 1930) SpO2:  [99 %-100 %] 99 % (11/02 1930) Weight:  [90.4 kg (199 lb 4.7 oz)] 90.4 kg (199 lb 4.7 oz) (11/02 1819)  General: In bed, NAD CV: Regular rate and rhythm Mental Status: Patient is awake, alert, oriented to person, place, month, year, and situation. Immediate and remote memory are intact. No signs of aphasia or neglect She does appear slightly confused, giving wrong day of the week repeatedly for doctor's appointment at  his upcoming. Cranial Nerves: II: Visual Fields are full. Pupils are equal, round, and reactive to light.  Discs are difficult to visualize. III,IV, VI: EOMI without ptosis or diploplia.  V: Facial sensation is symmetric to temperature VII: Facial movement is symmetric.  VIII: hearing is intact to voice X: Uvula elevates symmetrically XI: Shoulder shrug is symmetric. XII: tongue is midline without atrophy or fasciculations.  Motor: Tone is normal. Bulk is normal. 5/5 strength was present in all four extremities.  Sensory: Sensation is symmetric to light touch and temperature in the arms and legs. Deep Tendon Reflexes: 2+ and symmetric in the biceps and patellae.  Plantars: Toes are downgoing bilaterally.  Cerebellar: FNF and HKS are intact bilaterally Gait: Patient has a stable casual gait. Able to tandem, heel, and toe walk.   I have reviewed labs in epic and the results pertinent to this consultation are: Glucose 517  I have reviewed the images obtained: T. head-negative   Impression: 62 year old female with transient visual change with subsequent confusion. 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 I would recommend aggressive hyperglycemia control is the main treatment modality for this. I also would favor an MRI of her brain to rule out stroke, though changes can be seen with hyperglycemia induced partial seizures as well.  Recommendations: 1) MRI brain 2) will give a single dose of Keppra, however the main treatment modality would  be hyperglycemia control,  And would not favor long-term antiepileptic therapy, she has unprovoked seizures in the future 3) I do not think that an EEG would be helpful as this was very likely a provoked seizure.   Roland Rack, MD [facility] [phone]  If 7pm- 7am, please page neurology on call at [phone].

## 2013-04-21 NOTE — ED Notes (Signed)
Arrived in CT

## 2013-04-21 NOTE — ED Provider Notes (Signed)
CSN: [id number]     Arrival date & time 04/21/13  1751 History   First MD Initiated Contact with Patient 04/21/13 1754     Chief Complaint  Patient presents with  . Code Stroke   (Consider location/radiation/quality/duration/timing/severity/associated sxs/prior Treatment) The history is provided by the patient.  History limited by critical acuity of patient condition.   Pt was in her car when she started to have symptoms including visual changes, confusion, difficulty finding words. She called her family who noted slurred speech. This occurred suddenly at 330 PM. Symptoms have improved. Pt denies pain. Associated symptoms include no recent head injury, no drug ingestions.   Past Medical History  Diagnosis Date  . Diabetes mellitus without complication [id number]  . Hypertension   . Hypercholesteremia   . Fibromyalgia   . Arthritis    Past Surgical History  Procedure Laterality Date  . Laparoscopic cholecystectomy  2003    Dr. Zella Richer; Chronic calculous cholecystitis and fatty infiltration of the liver  . Reconstruction of nose      cosmetic   Family History  Problem Relation Age of Onset  . Heart attack Father   . Diabetes Mother     alive, 81yo (4)  . Hypertension Mother    History  Substance Use Topics  . Smoking status: Never Smoker   . Smokeless tobacco: Never Used  . Alcohol Use: No   OB History   Grav Para Term Preterm Abortions TAB SAB Ect Mult Living                 Review of Systems Constitutional: Negative for fever.  Eyes: Negative for vision loss.  ENT: Negative for difficulty swallowing.  Cardiovascular: Negative for chest pain. Respiratory: Negative for respiratory distress.  Gastrointestinal:  Negative for vomiting.  Genitourinary: Negative for inability to void.  Musculoskeletal: Negative for gait problem.  Integumentary: Negative for rash.  Neurological: Negative for new focal weakness.     Allergies  Morphine and related and  Penicillins  Home Medications  No current outpatient prescriptions on file. BP 145/72  Pulse 72  Temp(Src) 97.7 F (36.5 C) (Oral)  Resp 20  Ht 5\' 4"  (1.626 m)  Wt 197 lb 3.2 oz (89.449 kg)  BMI 33.83 kg/m2  SpO2 96% Physical Exam Nursing note and vitals reviewed.  Constitutional: Pt is alert and appears stated age. Eyes: No injection, no scleral icterus. HENT: Atraumatic, airway open without erythema or exudate.  Respiratory: No respiratory distress. Equal breathing bilaterally. Cardiovascular: Normal rate. Extremities warm and well perfused.  Abdomen: Soft, non-tender. MSK: Extremities are atraumatic without deformity. Skin: No rash, no wounds.   Neuro: No motor nor sensory deficit. GCS 15. Normal CN. Normal coordination.      ED Course  Procedures (including critical care time) Labs Review Labs Reviewed  COMPREHENSIVE METABOLIC PANEL - Abnormal; Notable for the following:    Sodium 134 (*)    Glucose, Bld 517 (*)    Albumin 3.4 (*)    Total Bilirubin 0.2 (*)    GFR calc non Af Amer 89 (*)    All other components within normal limits  GLUCOSE, CAPILLARY - Abnormal; Notable for the following:    Glucose-Capillary 448 (*)    All other components within normal limits  GLUCOSE, CAPILLARY - Abnormal; Notable for the following:    Glucose-Capillary 393 (*)    All other components within normal limits  GLUCOSE, CAPILLARY - Abnormal; Notable for the following:    Glucose-Capillary 393 (*)  All other components within normal limits  URINALYSIS, ROUTINE W REFLEX MICROSCOPIC - Abnormal; Notable for the following:    APPearance CLOUDY (*)    Specific Gravity, Urine 1.035 (*)    Glucose, UA >1000 (*)    All other components within normal limits  URINE MICROSCOPIC-ADD ON - Abnormal; Notable for the following:    Squamous Epithelial / LPF FEW (*)    All other components within normal limits  GLUCOSE, CAPILLARY - Abnormal; Notable for the following:    Glucose-Capillary  406 (*)    All other components within normal limits  POCT I-STAT, CHEM 8 - Abnormal; Notable for the following:    Glucose, Bld 517 (*)    All other components within normal limits  URINE CULTURE  PROTIME-INR  APTT  CBC  DIFFERENTIAL  TROPONIN I  BASIC METABOLIC PANEL  HEMOGLOBIN A1C  LIPID PANEL  POCT I-STAT TROPONIN I   Imaging Review Dg Chest 2 View  04/21/2013   CLINICAL DATA:  62 year old female with hyperglycemia.  Code stroke.  EXAM: CHEST  2 VIEW  COMPARISON:  None.  FINDINGS: Lung volumes are within normal limits. Mild to moderate cardiomegaly. Other mediastinal contours are within normal limits. Visualized tracheal air column is within normal limits. No pneumothorax or pulmonary edema. No pleural effusion. Patchy bibasilar opacity most resembles atelectasis. Mild scoliosis. No acute osseous abnormality identified.  IMPRESSION: Mild cardiomegaly and basilar atelectasis.   Electronically Signed   By: Lars Pinks M.D.   On: 04/21/2013 22:50   Ct Head (brain) Wo Contrast  04/21/2013   CLINICAL DATA:  Acute stroke.  EXAM: CT HEAD WITHOUT CONTRAST  TECHNIQUE: Contiguous axial images were obtained from the base of the skull through the vertex without intravenous contrast.  COMPARISON:  11/28/2007  FINDINGS: There is no evidence of intracranial hemorrhage, brain edema, or other signs of acute infarction. There is no evidence of intracranial mass lesion or mass effect. No abnormal extraaxial fluid collections are identified.  Old infarct again seen involving the right posterior inferior cerebellum. Ventricles are normal in size. No other intracranial abnormality identified. No skull abnormality visualized.  IMPRESSION: No acute intracranial findings.  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   Electronically Signed   By: Earle Gell M.D.   On: 04/21/2013 18:07    EKG Interpretation     Ventricular Rate:    PR Interval:    QRS Duration:   QT Interval:    QTC Calculation:   R Axis:      Text Interpretation:              MDM   1. TIA (transient ischemic attack)   2. Diabetes mellitus without complication   3. Hypertension    62 y.o. female w/ PMHx of DM, HTN, HL presents as a code stroke due to slurred speech. Pt also with several other complaints such as confusion and visual changes. Arrived here with GCS 15, protecting her airway. Evaluated with neurology upon arrival. Plan to proceed with work up.   Work up remarkable for hyperglycemia. Pt given IVF bolus with plan to repeat FSBS. CT head with NAICA, old right cerebellar infarct. CXR normal. EKG without signs of ischemia. Labs unremarkable. Pt stable on re-eval without acute complaint. Neuro with recs for admission for MRI. Medicine service called.    I independently viewed, interpreted, and used in my medical decision making all ordered lab and imaging tests. Medical Decision Making discussed with ED attending Dr. Wilson Singer.  Dayton Scrape, MD 04/22/13 0025

## 2013-04-21 NOTE — ED Notes (Addendum)
The ordered 1800 neuro check was completed by Pincus Large RN at 18:28.

## 2013-04-21 NOTE — ED Notes (Signed)
Dorothy Harmon He would like to be called if any unexpected changes occur with his mothers condition.

## 2013-04-21 NOTE — ED Notes (Signed)
EDP exam

## 2013-04-22 ENCOUNTER — Observation Stay (HOSPITAL_COMMUNITY): Payer: Self-pay

## 2013-04-22 ENCOUNTER — Encounter (HOSPITAL_COMMUNITY): Payer: Self-pay | Admitting: Internal Medicine

## 2013-04-22 DIAGNOSIS — E785 Hyperlipidemia, unspecified: Secondary | ICD-10-CM | POA: Diagnosis present

## 2013-04-22 DIAGNOSIS — I6789 Other cerebrovascular disease: Secondary | ICD-10-CM

## 2013-04-22 LAB — LIPID PANEL
Cholesterol: 200 mg/dL (ref 0–200)
HDL: 36 mg/dL — ABNORMAL LOW (ref >39)
LDL Cholesterol: 111 mg/dL — ABNORMAL HIGH (ref 0–99)
Total CHOL/HDL Ratio: 5.6 RATIO
Triglycerides: 263 mg/dL — ABNORMAL HIGH (ref <150)
VLDL: 53 mg/dL — ABNORMAL HIGH (ref 0–40)

## 2013-04-22 LAB — RAPID URINE DRUG SCREEN, HOSP PERFORMED
Barbiturates: NOT DETECTED
Benzodiazepines: NOT DETECTED

## 2013-04-22 LAB — BASIC METABOLIC PANEL
BUN: 14 mg/dL (ref 6–23)
CO2: 25 mEq/L (ref 19–32)
Calcium: 8.1 mg/dL — ABNORMAL LOW (ref 8.4–10.5)
Chloride: 108 mEq/L (ref 96–112)
Creatinine, Ser: 0.59 mg/dL (ref 0.50–1.10)
GFR calc Af Amer: >90 mL/min (ref >90)
GFR calc non Af Amer: >90 mL/min (ref >90)
Glucose, Bld: 245 mg/dL — ABNORMAL HIGH (ref 70–99)
Potassium: 3.5 mEq/L (ref 3.5–5.1)
Sodium: 141 mEq/L (ref 135–145)

## 2013-04-22 LAB — GLUCOSE, CAPILLARY
Glucose-Capillary: 238 mg/dL — ABNORMAL HIGH (ref 70–99)
Glucose-Capillary: 225 mg/dL — ABNORMAL HIGH (ref 70–99)
Glucose-Capillary: 234 mg/dL — ABNORMAL HIGH (ref 70–99)
Glucose-Capillary: 271 mg/dL — ABNORMAL HIGH (ref 70–99)

## 2013-04-22 LAB — HEMOGLOBIN A1C
Hgb A1c MFr Bld: 12.9 % — ABNORMAL HIGH (ref <5.7)
Mean Plasma Glucose: 324 mg/dL — ABNORMAL HIGH (ref <117)

## 2013-04-22 MED ORDER — INSULIN GLARGINE 100 UNIT/ML ~~LOC~~ SOLN
10.0000 [IU] | Freq: Every day | SUBCUTANEOUS | Status: DC
  Administered 2013-04-22 – 2013-04-23 (×2): 10 [IU] via SUBCUTANEOUS
  Filled 2013-04-22 (×3): qty 0.1

## 2013-04-22 MED ORDER — ASPIRIN 81 MG PO CHEW
81.0000 mg | CHEWABLE_TABLET | Freq: Every day | ORAL | Status: DC
  Administered 2013-04-23: 81 mg via ORAL
  Filled 2013-04-22: qty 1

## 2013-04-22 MED ORDER — ATORVASTATIN CALCIUM 40 MG PO TABS
40.0000 mg | ORAL_TABLET | Freq: Every day | ORAL | Status: DC
  Administered 2013-04-22 – 2013-04-23 (×2): 40 mg via ORAL
  Filled 2013-04-22 (×3): qty 1

## 2013-04-22 NOTE — Progress Notes (Signed)
Echocardiogram 2D Echocardiogram has been performed.  Dorothy Harmon 04/22/2013, 4:07 PM

## 2013-04-22 NOTE — Progress Notes (Signed)
Subjective: Dorothy Harmon is feeling better this morning and afternoon than she was yesterday evening.  Pt states that she has felt tired today and still having difficulty remembering phone numbers that she used to have memorized but otherwise doing well, wanting to go back to work asap. Speaking coherently, no pain or weakness.    Objective: Vital signs in last 24 hours: Filed Vitals:   04/22/13 0754 04/22/13 1100 04/22/13 1650 04/22/13 2000  BP: 116/52 147/88 150/88 142/74  Pulse: 76 68 73 73  Temp: 97.8 F (36.6 C) 97 F (36.1 C)  98.5 F (36.9 C)  TempSrc: Oral Oral  Oral  Resp: 16 16 15 16   Height:      Weight:      SpO2: 97% 98% 97% 95%   Weight change:   Intake/Output Summary (Last 24 hours) at 04/22/13 2244 Last data filed at 04/22/13 0400  Gross per 24 hour  Intake      0 ml  Output    500 ml  Net   -500 ml   PEX General: alert, cooperative, and in no apparent distress HEENT: pupils equal round and reactive to light, vision grossly intact, oropharynx clear and non-erythematous  Neck: supple, no lymphadenopathy Lungs: clear to ascultation bilaterally, normal work of respiration, no wheezes, rales, ronchi Heart: regular rate and rhythm, no murmurs, gallops, or rubs Abdomen: soft, non-tender, non-distended, normal bowel sounds Extremities: 2+ DP/PT pulses bilaterally, no cyanosis, clubbing, or edema Neurologic: alert & oriented X3, cranial nerves II-XII intact, strength grossly intact, sensation intact to light touch   Lab Results: Basic Metabolic Panel:  Recent Labs Lab 04/21/13 1754 04/21/13 1821 04/22/13 0535  NA 134* 138 141  K 3.8 3.8 3.5  CL 98 101 108  CO2 22  --  25  GLUCOSE 517* 517* 245*  BUN 21 23 14   CREATININE 0.74 0.90 0.59  CALCIUM 9.0  --  8.1*   Liver Function Tests:  Recent Labs Lab 04/21/13 1754  AST 14  ALT 12  ALKPHOS 99  BILITOT 0.2*  PROT 6.7  ALBUMIN 3.4*   CBC:  Recent Labs Lab 04/21/13 1754 04/21/13 1821  WBC  6.0  --   NEUTROABS 3.4  --   HGB 14.5 14.6  HCT 40.7 43.0  MCV 85.5  --   PLT 231  --    Cardiac Enzymes:  Recent Labs Lab 04/21/13 1754  TROPONINI <0.30   CBG:  Recent Labs Lab 04/21/13 2131 04/21/13 2303 04/22/13 0738 04/22/13 1137 04/22/13 1646 04/22/13 2032  GLUCAP 393* 406* 238* 225* 234* 271*   Hemoglobin A1C:  Recent Labs Lab 04/22/13 0535  HGBA1C 12.9*   Fasting Lipid Panel:  Recent Labs Lab 04/22/13 0535  CHOL 200  HDL 36*  LDLCALC 111*  TRIG 263*  CHOLHDL 5.6   Coagulation:  Recent Labs Lab 04/21/13 1754  LABPROT 11.7  INR 0.87   Urine Drug Screen: Drugs of Abuse     Component Value Date/Time   LABOPIA NONE DETECTED 04/22/2013 1808   COCAINSCRNUR NONE DETECTED 04/22/2013 1808   LABBENZ NONE DETECTED 04/22/2013 1808   AMPHETMU NONE DETECTED 04/22/2013 1808   THCU NONE DETECTED 04/22/2013 1808   LABBARB NONE DETECTED 04/22/2013 1808    Urinalysis:  Recent Labs Lab 04/21/13 2233  COLORURINE YELLOW  LABSPEC 1.035*  PHURINE 5.0  GLUCOSEU >1000*  HGBUR NEGATIVE  BILIRUBINUR NEGATIVE  KETONESUR NEGATIVE  PROTEINUR NEGATIVE  UROBILINOGEN 0.2  NITRITE NEGATIVE  LEUKOCYTESUR NEGATIVE   Studies/Results:  Dg Chest 2 View  04/21/2013   CLINICAL DATA:  62 year old female with hyperglycemia.  Code stroke.  EXAM: CHEST  2 VIEW  COMPARISON:  None.  FINDINGS: Lung volumes are within normal limits. Mild to moderate cardiomegaly. Other mediastinal contours are within normal limits. Visualized tracheal air column is within normal limits. No pneumothorax or pulmonary edema. No pleural effusion. Patchy bibasilar opacity most resembles atelectasis. Mild scoliosis. No acute osseous abnormality identified.  IMPRESSION: Mild cardiomegaly and basilar atelectasis.   Electronically Signed   By: Lars Pinks M.D.   On: 04/21/2013 22:50   Ct Head (brain) Wo Contrast  04/21/2013   CLINICAL DATA:  Acute stroke.  EXAM: CT HEAD WITHOUT CONTRAST  TECHNIQUE: Contiguous  axial images were obtained from the base of the skull through the vertex without intravenous contrast.  COMPARISON:  11/28/2007  FINDINGS: There is no evidence of intracranial hemorrhage, brain edema, or other signs of acute infarction. There is no evidence of intracranial mass lesion or mass effect. No abnormal extraaxial fluid collections are identified.  Old infarct again seen involving the right posterior inferior cerebellum. Ventricles are normal in size. No other intracranial abnormality identified. No skull abnormality visualized.  IMPRESSION: No acute intracranial findings.  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   Electronically Signed   By: Earle Gell M.D.   On: 04/21/2013 18:07   Mr Jodene Nam Head Wo Contrast  04/22/2013   CLINICAL DATA:  Transient visual hallucinations.  Occipital seizure.  EXAM: MRI HEAD WITHOUT CONTRAST  MRA HEAD WITHOUT CONTRAST  TECHNIQUE: Multiplanar, multiecho pulse sequences of the brain and surrounding structures were obtained without intravenous contrast. Angiographic images of the head were obtained using MRA technique without contrast.  COMPARISON:  Head CT 04/21/2013  FINDINGS: MRI HEAD FINDINGS  Diffusion imaging shows a 1.2 cm acute infarction within the left thalamus. No other acute infarction. No evidence of mass effect or hemorrhage.  The brainstem is unremarkable. There are a few old cerebellar infarctions on the right. The cerebral hemispheres show minimal chronic small vessel change within the white matter. No evidence of mass lesion, hemorrhage, hydrocephalus or extra-axial collection. No pituitary mass. No inflammatory sinus disease. No skull or skullbase lesion.  MRA HEAD FINDINGS  The both internal carotid arteries are widely patent into the brain. The anterior and middle cerebral vessels are patent without proximal stenosis, aneurysm or vascular malformation. There is some asymmetric fat within the petrous apex on the right. The  Both vertebral arteries are patent  but diminutive. The basilar artery is patent but diminutive. The left posterior cerebral artery takes a fetal origin from the left carotid and appears widely patent. The right posterior cerebral artery receive supply from the basilar tip as well as a patent posterior communicating artery. There appears to be mild atherosclerotic narrowing in that region. Both superior cerebellar arteries are patent. Patent posterior inferior cerebellar arteries are noted on each side.  IMPRESSION: 1.2 cm acute infarction within the left thalamus. No hemorrhage or swelling.  Old small vessel infarctions in the right cerebellum. Mild chronic small-vessel change of the cerebral hemispheric white matter.  No major vessel occlusion. Posterior circulation branch vessels are diminutive and there is some atherosclerotic narrowing of the proximal right posterior cerebral artery.   Electronically Signed   By: Nelson Chimes M.D.   On: 04/22/2013 13:07   Mr Brain Wo Contrast  04/22/2013   CLINICAL DATA:  Transient visual hallucinations.  Occipital seizure.  EXAM: MRI HEAD WITHOUT CONTRAST  MRA  HEAD WITHOUT CONTRAST  TECHNIQUE: Multiplanar, multiecho pulse sequences of the brain and surrounding structures were obtained without intravenous contrast. Angiographic images of the head were obtained using MRA technique without contrast.  COMPARISON:  Head CT 04/21/2013  FINDINGS: MRI HEAD FINDINGS  Diffusion imaging shows a 1.2 cm acute infarction within the left thalamus. No other acute infarction. No evidence of mass effect or hemorrhage.  The brainstem is unremarkable. There are a few old cerebellar infarctions on the right. The cerebral hemispheres show minimal chronic small vessel change within the white matter. No evidence of mass lesion, hemorrhage, hydrocephalus or extra-axial collection. No pituitary mass. No inflammatory sinus disease. No skull or skullbase lesion.  MRA HEAD FINDINGS  The both internal carotid arteries are widely patent  into the brain. The anterior and middle cerebral vessels are patent without proximal stenosis, aneurysm or vascular malformation. There is some asymmetric fat within the petrous apex on the right. The  Both vertebral arteries are patent but diminutive. The basilar artery is patent but diminutive. The left posterior cerebral artery takes a fetal origin from the left carotid and appears widely patent. The right posterior cerebral artery receive supply from the basilar tip as well as a patent posterior communicating artery. There appears to be mild atherosclerotic narrowing in that region. Both superior cerebellar arteries are patent. Patent posterior inferior cerebellar arteries are noted on each side.  IMPRESSION: 1.2 cm acute infarction within the left thalamus. No hemorrhage or swelling.  Old small vessel infarctions in the right cerebellum. Mild chronic small-vessel change of the cerebral hemispheric white matter.  No major vessel occlusion. Posterior circulation branch vessels are diminutive and there is some atherosclerotic narrowing of the proximal right posterior cerebral artery.   Electronically Signed   By: Nelson Chimes M.D.   On: 04/22/2013 13:07   Medications: I have reviewed the patient's current medications. Scheduled Meds: . [START ON 04/23/2013] aspirin  81 mg Oral Daily  . atorvastatin  40 mg Oral q1800  . enoxaparin (LOVENOX) injection  40 mg Subcutaneous Q24H  . insulin aspart  0-15 Units Subcutaneous TID WC  . insulin glargine  10 Units Subcutaneous QHS   Continuous Infusions: . sodium chloride 100 mL/hr at 04/21/13 2330   Assessment/Plan: #Acute thalamic stroke: MRI showed 1.2 cm acute left thalamic infarct without hemorrhage or swelling; no major vessel occlusion on MRA.  Atypical presentation for thalamic infarct, no sensory sx or aphasia.  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  Troponin x1 negative.  Received Keppra loading dose for possible seizure.   Bedside swallow negative.  Per neuro note, "Thalamic infarcts have been associated with hallucinations in the literature, though this episode was atypical. She continues to be mildly confused and until the true extent of her confusion is clear, I would favor her limiting driving and working for at least several weeks." -continue telemetry -continue neuro checks q2h -initiate ASA 81 mg daily -echo, bilateral carotid dopplers pending -PT'S SON WOULD LIKE DR. Rosebush Fara Olden Tawni Pummel [phone])  #DM2, uncontrolled- A1C 12.9%. CBGs 220s-230s.   -Lantus 10 units daily, SSI moderate -CBGs AC and qhs  #HTN- Avoid antihypertensives at this time for permissive HTN.  #HL- LDL 111, pt has DM2 and acute stroke thus will initiate statin therapy -start Lipitor 40 mg daily  DVT PPX- Lovenox   Dispo: Disposition is deferred at this time, awaiting improvement of current medical problems.  Anticipated discharge tomorrow.  The patient  does have a current PCP Haywood Pao, MD) and does need an [facility] hospital follow-up appointment after discharge.   .Services Needed at time of discharge: Y = Yes, Blank = No PT:   OT:   RN:   Equipment:   Other:     LOS: 1 day   Ivin Poot, MD 04/22/2013, 10:44 PM

## 2013-04-22 NOTE — Progress Notes (Signed)
VASCULAR LAB PRELIMINARY  PRELIMINARY  PRELIMINARY  PRELIMINARY  Carotid duplex completed.    Preliminary report:  Bilateral:  1-39% ICA stenosis.  Vertebral artery flow is antegrade.     Daveah Varone, RVS 04/22/2013, 3:25 PM

## 2013-04-22 NOTE — Progress Notes (Addendum)
Inpatient Diabetes Program Recommendations  AACE/ADA: New Consensus Statement on Inpatient Glycemic Control (2013)  Target Ranges:  Prepandial:   less than 140 mg/dL      Peak postprandial:   less than 180 mg/dL (1-2 hours)      Critically ill patients:  140 - 180 mg/dL   Results for KHADEJA, DEADMOND (MRN [id number]) as of 04/22/2013 13:00  Ref. Range 04/21/2013 17:54 04/21/2013 18:21 04/22/2013 05:35  Glucose Latest Range: 70-99 mg/dL 517 (H) 517 (H) 245 (H)   Inpatient Diabetes Program Recommendations Insulin - Basal: Please consider increasing Lantus to 15 units QHS. Correction (SSI): May want to consider increasing Novolog correction scale to resistant scale.  Note: Patient has a history of diabetes but not able to tell staff name of medication used as an outpatient for diabetes management.  Currently, patient is ordered to receive Novolog 0-15 units AC and for inpatient glycemic control.  Patient was given a one time dose of Lantus 5 units last night at 23:58.  Since patient was not able to verbalize DM medication, [facility] (patient's [facility]) was called to inquire about DM medications.  [facility] reported they have NO diabetes medications listed for patient.  Noted Dr. Osborne Casco is patient's PCP so Dr. Loren Racer  Office was called to inquire about DM medications prescribed.  According to Dr. Loren Racer office patient was last seen in September 2014 (prior to that, last seen April 2013) for issues with blood pressure and A1C at that time was 8.3%.  According to information reported from Dr. Loren Racer office, patient was on Metformin in the past but she stopped it herself due to GI upset and is not currently prescribed any medications for diabetes.    Initial lab glucose was noted to be 517 mg/dl on 04/21/2013 and fasting glucose this morning ws 245 mg/dl.  Patient was given a one time dose of Lantus 5 units last night.  Please consider increasing Lantus to 15 units QHS and may want to consider increasing Novolog  correction scale to resistant scale.  Will continue to follow.  Thanks, Barnie Alderman, RN, MSN, CCRN Diabetes Coordinator Inpatient Diabetes Program [phone] (Team Pager) [phone] ([place] office) [phone] [facility] office)

## 2013-04-22 NOTE — Progress Notes (Signed)
Pt passed RN stroke swallow screen prior to being admitted on the floor in the ED. Screen is documented in the flow sheets will put pt on carb mod diet.

## 2013-04-22 NOTE — Progress Notes (Addendum)
Subjective: No further episodes  Exam: Filed Vitals:   04/22/13 0754  BP: 116/52  Pulse: 76  Temp: 97.8 F (36.6 C)  Resp: 16   Gen: In bed, NAD MS: Awake, alert, oriented and appropriate CN: Pupils equal round and reactive to light, extraocular movements intact, visual fields full Motor: 5/5 throughout Sensory: Intact to light touch  Impression: 62 year old female with a history of transient visual hallucinations the setting of severe hypoglycemia. It is very characteristic of occipital seizures associated with hypoglycemia. I would consider this a provoked seizure, and therefore not start antiepileptic therapy at this time.   Recommendations: 1) MRI brain 2) if no clear focal occipital lesion, then I would not favor any further workup. 3) management would focus on blood glucose control  Roland Rack, MD [facility] [phone]  If 7pm- 7am, please page neurology on call at [phone].    Addendum: MRI shows left thalamic infarct. Thalamic infarcts have been associated with hallucinations in the literature, though this episode was atypical. She continues to be mildly confused and until the true extent of her confusion is clear, I would favor her limiting driving and working for at least several weeks.  Roland Rack, MD [facility] [phone]  If 7pm- 7am, please page neurology on call at [phone].

## 2013-04-22 NOTE — H&P (Signed)
INTERNAL MEDICINE TEACHING SERVICE Attending Admission Note  Date: 04/22/2013  Patient name: Dorothy Harmon  Medical record number: [id number]  Date of birth: [birth year]    I have seen and evaluated [name] and discussed their care with the Residency Team.  62 year old woman with Type 2 DM, HTN, IBS, fibromyalgia, presented with visual and speech disturbances. The patient was noted to have hypoglycemia and this likely explains the occipital seizures she had. Appreciate neurology recs. No need at this time for AEDs.  MRI of the brain shows evidence of 1.2 cm acute infarct of left thalamus. Would discuss this with neurology, as she needs a CVA evaluation, including TTE. Start ASA.  Dominic Pea, DO, [facility] 04/22/2013, 2:32 PM

## 2013-04-23 DIAGNOSIS — E785 Hyperlipidemia, unspecified: Secondary | ICD-10-CM

## 2013-04-23 DIAGNOSIS — I10 Essential (primary) hypertension: Secondary | ICD-10-CM

## 2013-04-23 DIAGNOSIS — I6350 Cerebral infarction due to unspecified occlusion or stenosis of unspecified cerebral artery: Principal | ICD-10-CM

## 2013-04-23 LAB — URINE CULTURE: Colony Count: >=100000

## 2013-04-23 LAB — GLUCOSE, CAPILLARY
Glucose-Capillary: 285 mg/dL — ABNORMAL HIGH (ref 70–99)
Glucose-Capillary: 179 mg/dL — ABNORMAL HIGH (ref 70–99)
Glucose-Capillary: 250 mg/dL — ABNORMAL HIGH (ref 70–99)
Glucose-Capillary: 318 mg/dL — ABNORMAL HIGH (ref 70–99)

## 2013-04-23 MED ORDER — CLOPIDOGREL BISULFATE 75 MG PO TABS
75.0000 mg | ORAL_TABLET | Freq: Once | ORAL | Status: AC
  Administered 2013-04-23: 75 mg via ORAL

## 2013-04-23 MED ORDER — IRBESARTAN 300 MG PO TABS
300.0000 mg | ORAL_TABLET | Freq: Every day | ORAL | Status: DC
  Administered 2013-04-24: 300 mg via ORAL
  Filled 2013-04-23: qty 1

## 2013-04-23 NOTE — Progress Notes (Signed)
Subjective: Dorothy Harmon is feeling better this morning, asking to leave.  Alert and oriented x 3.  Speaking coherently, no pain or weakness.  Still not remembering phone numbers.  Change in plan per Dr. Leonie Man of neuro- now requesting PT/OT and ST evals along with initiation of Plavix given stroke on ASA.  Objective: Vital signs in last 24 hours: Filed Vitals:   04/22/13 2000 04/23/13 0000 04/23/13 0400 04/23/13 0749  BP: 142/74 145/86 145/84 148/90  Pulse: 73 72 81 67  Temp: 98.5 F (36.9 C) 97.9 F (36.6 C) 98.6 F (37 C) 98.7 F (37.1 C)  TempSrc: Oral Oral Oral Oral  Resp: 16 16 16 16   Height:      Weight:      SpO2: 95% 96% 95% 95%   Weight change:  No intake or output data in the 24 hours ending 04/23/13 0809 PEX General: alert, cooperative, and in no apparent distress HEENT: pupils equal round and reactive to light, vision grossly intact, oropharynx clear and non-erythematous  Neck: supple, no lymphadenopathy Lungs: clear to ascultation bilaterally, normal work of respiration, no wheezes, rales, ronchi Heart: regular rate and rhythm, no murmurs, gallops, or rubs Abdomen: soft, non-tender, non-distended, normal bowel sounds Extremities: 2+ DP/PT pulses bilaterally, no cyanosis, clubbing, or edema Neurologic: alert & oriented X3, cranial nerves II-XII intact, strength grossly intact, sensation intact to light touch   Lab Results: Basic Metabolic Panel:  Recent Labs Lab 04/21/13 1754 04/21/13 1821 04/22/13 0535  NA 134* 138 141  K 3.8 3.8 3.5  CL 98 101 108  CO2 22  --  25  GLUCOSE 517* 517* 245*  BUN 21 23 14   CREATININE 0.74 0.90 0.59  CALCIUM 9.0  --  8.1*   Liver Function Tests:  Recent Labs Lab 04/21/13 1754  AST 14  ALT 12  ALKPHOS 99  BILITOT 0.2*  PROT 6.7  ALBUMIN 3.4*   CBC:  Recent Labs Lab 04/21/13 1754 04/21/13 1821  WBC 6.0  --   NEUTROABS 3.4  --   HGB 14.5 14.6  HCT 40.7 43.0  MCV 85.5  --   PLT 231  --    Cardiac  Enzymes:  Recent Labs Lab 04/21/13 1754  TROPONINI <0.30   CBG:  Recent Labs Lab 04/21/13 2303 04/22/13 0738 04/22/13 1137 04/22/13 1646 04/22/13 2032 04/23/13 0745  GLUCAP 406* 238* 225* 234* 271* 285*   Hemoglobin A1C:  Recent Labs Lab 04/22/13 0535  HGBA1C 12.9*   Fasting Lipid Panel:  Recent Labs Lab 04/22/13 0535  CHOL 200  HDL 36*  LDLCALC 111*  TRIG 263*  CHOLHDL 5.6   Coagulation:  Recent Labs Lab 04/21/13 1754  LABPROT 11.7  INR 0.87   Urine Drug Screen: Drugs of Abuse     Component Value Date/Time   LABOPIA NONE DETECTED 04/22/2013 1808   COCAINSCRNUR NONE DETECTED 04/22/2013 1808   LABBENZ NONE DETECTED 04/22/2013 1808   AMPHETMU NONE DETECTED 04/22/2013 1808   THCU NONE DETECTED 04/22/2013 1808   LABBARB NONE DETECTED 04/22/2013 1808    Urinalysis:  Recent Labs Lab 04/21/13 2233  COLORURINE YELLOW  LABSPEC 1.035*  PHURINE 5.0  GLUCOSEU >1000*  HGBUR NEGATIVE  BILIRUBINUR NEGATIVE  KETONESUR NEGATIVE  PROTEINUR NEGATIVE  UROBILINOGEN 0.2  NITRITE NEGATIVE  LEUKOCYTESUR NEGATIVE   Studies/Results: Dg Chest 2 View  04/21/2013   CLINICAL DATA:  62 year old female with hyperglycemia.  Code stroke.  EXAM: CHEST  2 VIEW  COMPARISON:  None.  FINDINGS: Lung  volumes are within normal limits. Mild to moderate cardiomegaly. Other mediastinal contours are within normal limits. Visualized tracheal air column is within normal limits. No pneumothorax or pulmonary edema. No pleural effusion. Patchy bibasilar opacity most resembles atelectasis. Mild scoliosis. No acute osseous abnormality identified.  IMPRESSION: Mild cardiomegaly and basilar atelectasis.   Electronically Signed   By: Lars Pinks M.D.   On: 04/21/2013 22:50   Ct Head (brain) Wo Contrast  04/21/2013   CLINICAL DATA:  Acute stroke.  EXAM: CT HEAD WITHOUT CONTRAST  TECHNIQUE: Contiguous axial images were obtained from the base of the skull through the vertex without intravenous contrast.   COMPARISON:  11/28/2007  FINDINGS: There is no evidence of intracranial hemorrhage, brain edema, or other signs of acute infarction. There is no evidence of intracranial mass lesion or mass effect. No abnormal extraaxial fluid collections are identified.  Old infarct again seen involving the right posterior inferior cerebellum. Ventricles are normal in size. No other intracranial abnormality identified. No skull abnormality visualized.  IMPRESSION: No acute intracranial findings.  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   Electronically Signed   By: Earle Gell M.D.   On: 04/21/2013 18:07   Mr Jodene Nam Head Wo Contrast  04/22/2013   CLINICAL DATA:  Transient visual hallucinations.  Occipital seizure.  EXAM: MRI HEAD WITHOUT CONTRAST  MRA HEAD WITHOUT CONTRAST  TECHNIQUE: Multiplanar, multiecho pulse sequences of the brain and surrounding structures were obtained without intravenous contrast. Angiographic images of the head were obtained using MRA technique without contrast.  COMPARISON:  Head CT 04/21/2013  FINDINGS: MRI HEAD FINDINGS  Diffusion imaging shows a 1.2 cm acute infarction within the left thalamus. No other acute infarction. No evidence of mass effect or hemorrhage.  The brainstem is unremarkable. There are a few old cerebellar infarctions on the right. The cerebral hemispheres show minimal chronic small vessel change within the white matter. No evidence of mass lesion, hemorrhage, hydrocephalus or extra-axial collection. No pituitary mass. No inflammatory sinus disease. No skull or skullbase lesion.  MRA HEAD FINDINGS  The both internal carotid arteries are widely patent into the brain. The anterior and middle cerebral vessels are patent without proximal stenosis, aneurysm or vascular malformation. There is some asymmetric fat within the petrous apex on the right. The  Both vertebral arteries are patent but diminutive. The basilar artery is patent but diminutive. The left posterior cerebral artery takes a  fetal origin from the left carotid and appears widely patent. The right posterior cerebral artery receive supply from the basilar tip as well as a patent posterior communicating artery. There appears to be mild atherosclerotic narrowing in that region. Both superior cerebellar arteries are patent. Patent posterior inferior cerebellar arteries are noted on each side.  IMPRESSION: 1.2 cm acute infarction within the left thalamus. No hemorrhage or swelling.  Old small vessel infarctions in the right cerebellum. Mild chronic small-vessel change of the cerebral hemispheric white matter.  No major vessel occlusion. Posterior circulation branch vessels are diminutive and there is some atherosclerotic narrowing of the proximal right posterior cerebral artery.   Electronically Signed   By: Nelson Chimes M.D.   On: 04/22/2013 13:07   Mr Brain Wo Contrast  04/22/2013   CLINICAL DATA:  Transient visual hallucinations.  Occipital seizure.  EXAM: MRI HEAD WITHOUT CONTRAST  MRA HEAD WITHOUT CONTRAST  TECHNIQUE: Multiplanar, multiecho pulse sequences of the brain and surrounding structures were obtained without intravenous contrast. Angiographic images of the head were obtained using MRA technique without  contrast.  COMPARISON:  Head CT 04/21/2013  FINDINGS: MRI HEAD FINDINGS  Diffusion imaging shows a 1.2 cm acute infarction within the left thalamus. No other acute infarction. No evidence of mass effect or hemorrhage.  The brainstem is unremarkable. There are a few old cerebellar infarctions on the right. The cerebral hemispheres show minimal chronic small vessel change within the white matter. No evidence of mass lesion, hemorrhage, hydrocephalus or extra-axial collection. No pituitary mass. No inflammatory sinus disease. No skull or skullbase lesion.  MRA HEAD FINDINGS  The both internal carotid arteries are widely patent into the brain. The anterior and middle cerebral vessels are patent without proximal stenosis, aneurysm or  vascular malformation. There is some asymmetric fat within the petrous apex on the right. The  Both vertebral arteries are patent but diminutive. The basilar artery is patent but diminutive. The left posterior cerebral artery takes a fetal origin from the left carotid and appears widely patent. The right posterior cerebral artery receive supply from the basilar tip as well as a patent posterior communicating artery. There appears to be mild atherosclerotic narrowing in that region. Both superior cerebellar arteries are patent. Patent posterior inferior cerebellar arteries are noted on each side.  IMPRESSION: 1.2 cm acute infarction within the left thalamus. No hemorrhage or swelling.  Old small vessel infarctions in the right cerebellum. Mild chronic small-vessel change of the cerebral hemispheric white matter.  No major vessel occlusion. Posterior circulation branch vessels are diminutive and there is some atherosclerotic narrowing of the proximal right posterior cerebral artery.   Electronically Signed   By: Nelson Chimes M.D.   On: 04/22/2013 13:07   Medications: I have reviewed the patient's current medications. Scheduled Meds: . aspirin  81 mg Oral Daily  . atorvastatin  40 mg Oral q1800  . enoxaparin (LOVENOX) injection  40 mg Subcutaneous Q24H  . insulin aspart  0-15 Units Subcutaneous TID WC  . insulin glargine  10 Units Subcutaneous QHS   Continuous Infusions: . sodium chloride 100 mL/hr at 04/22/13 2310   Assessment/Plan: #Acute thalamic stroke- MRI showed 1.2 cm acute left thalamic infarct without hemorrhage or swelling; no major vessel occlusion on MRA.  Atypical presentation for thalamic infarct, no sensory sx or aphasia.  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  Bilateral carotid dopplers negative.  Echo negative (EF [id number], normal systolic function, normal wall motion).   -continue telemetry -continue neuro checks q2h -initiate Plavix 75 mg PO qd (discontinue  ASA) -PT/OT, ST consults today for discharge planning  #DM2, uncontrolled- A1C 12.9%. CBGs 220s-230s.   -Lantus 10 units daily, SSI moderate -CBGs AC and qhs  #HTN- BP 140s/80s. Avoiding antihypertensives at this time for permissive HTN.  -restart irbesartan tomorrow AM  #HL- LDL 111, pt has DM2 and acute stroke thus will initiate statin therapy -continue Lipitor 40 mg daily  DVT PPX- Lovenox   Dispo: Disposition is deferred at this time, awaiting improvement of current medical problems.  Anticipated discharge tomorrow.  The patient does have a current PCP Haywood Pao, MD) and does need an [facility] hospital follow-up appointment after discharge.   .Services Needed at time of discharge: Y = Yes, Blank = No PT:   OT:   RN:   Equipment:   Other:     LOS: 2 days   Ivin Poot, MD 04/23/2013, 8:09 AM

## 2013-04-23 NOTE — Progress Notes (Signed)
CSW (Clinical Education officer, museum) received consult. Consult more appropriate for CM. Have informed CM about referral.  [place], [place]

## 2013-04-23 NOTE — Progress Notes (Signed)
Inpatient Diabetes Program Recommendations  AACE/ADA: New Consensus Statement on Inpatient Glycemic Control (2013)  Target Ranges:  Prepandial:   less than 140 mg/dL      Peak postprandial:   less than 180 mg/dL (1-2 hours)      Critically ill patients:  140 - 180 mg/dL   Results for BERNADETT, SCHULKE (MRN [id number]) as of 04/23/2013 13:18  Ref. Range 04/22/2013 07:38 04/22/2013 11:37 04/22/2013 16:46 04/22/2013 20:32 04/23/2013 07:45 04/23/2013 12:04  Glucose-Capillary Latest Range: 70-99 mg/dL 238 (H) 225 (H) 234 (H) 271 (H) 285 (H) 179 (H)   Inpatient Diabetes Program Recommendations Insulin - Basal: Please increase Lantus to 15 units QHS. Correction (SSI): Please order Novolog bedtime correction and may want to consider increasing Novolog correction scale to resistant correction scale if post prandial glucose consistently greater than 180 mg/dl.   Thanks, Barnie Alderman, RN, MSN, CCRN Diabetes Coordinator Inpatient Diabetes Program [phone] (Team Pager) [phone] ([facility] office) [phone] [facility] office)

## 2013-04-23 NOTE — Discharge Summary (Signed)
Name: Dorothy Harmon MRN: [id number] DOB: [birth year] 62 y.o. PCP: Haywood Pao, MD  Date of Admission: 04/21/2013  5:52 PM Date of Discharge: 04/23/2013 Attending Physician: Murlean Caller  Discharge Diagnosis: Principal Problem:   Stroke Active Problems:   Uncontrolled diabetes mellitus without complication   Hypertension   Personal history of transient ischemic attack (TIA) and cerebral infarction without residual deficit   Dyslipidemia  Discharge Medications:   Medication List    STOP taking these medications       aspirin 325 MG tablet      TAKE these medications       clopidogrel 75 MG tablet  Commonly known as:  PLAVIX  Take 1 tablet (75 mg total) by mouth daily with breakfast.     irbesartan 300 MG tablet  Commonly known as:  AVAPRO  Take 300 mg by mouth daily.     lovastatin 20 MG tablet  Commonly known as:  MEVACOR  Take 2 tablets (40 mg total) by mouth at bedtime.     metFORMIN 500 MG tablet  Commonly known as:  GLUCOPHAGE  Take 1 tablet (500 mg total) by mouth 2 (two) times daily with a meal.     multivitamin tablet  Take 1 tablet by mouth daily.        Disposition and follow-up:   Ms.Dorothy Harmon was discharged from [facility] in Stable condition.  At the hospital follow up visit please address:  1.  Medication compliance (esp Plavix), cognitive status  2.  Consider up-titration of metformin vs. Initiation of insulin therapy  3.  Consider switching to high potency statin (patient discharged on lovastatin given financial constraints)  4.  Labs / imaging needed at time of follow-up: none  5.  Pending labs/ test needing follow-up: none  Follow-up Appointments: Follow-up Information   Follow up with Haywood Pao, MD On 05/03/2013. (@4pm )    Specialty:  Internal Medicine   Contact information:   [facility] [place] [address] [phone]       Follow up with Forbes Cellar, MD On 07/02/2013. (@1 :45pm)    Specialties:  Neurology, Radiology   Contact information:   [address] [place] [address] [phone]       Follow up with Philmore Pali, NP In 1 month. (stroke clinic)    Specialty:  Nurse Practitioner   Contact information:   [place] [address] [phone]       Discharge Instructions: Discharge Orders   Future Appointments Provider Department Dept Phone   05/28/2013 2:00 PM Garvin Fila, MD [facility] [phone]   Future Orders Complete By Expires   Call MD for:  difficulty breathing, headache or visual disturbances  As directed    Diet - low sodium heart healthy  As directed    Increase activity slowly  As directed       Consultations:   neurology  Procedures Performed:  Dg Chest 2 View  04/21/2013   CLINICAL DATA:  62 year old female with hyperglycemia.  Code stroke.  EXAM: CHEST  2 VIEW  COMPARISON:  None.  FINDINGS: Lung volumes are within normal limits. Mild to moderate cardiomegaly. Other mediastinal contours are within normal limits. Visualized tracheal air column is within normal limits. No pneumothorax or pulmonary edema. No pleural effusion. Patchy bibasilar opacity most resembles atelectasis. Mild scoliosis. No acute osseous abnormality identified.  IMPRESSION: Mild cardiomegaly and basilar atelectasis.   Electronically  Signed   By: Lars Pinks M.D.   On: 04/21/2013 22:50   Ct Head (brain) Wo Contrast  04/21/2013   CLINICAL DATA:  Acute stroke.  EXAM: CT HEAD WITHOUT CONTRAST  TECHNIQUE: Contiguous axial images were obtained from the base of the skull through the vertex without intravenous contrast.  COMPARISON:  11/28/2007  FINDINGS: There is no evidence of intracranial hemorrhage, brain edema, or other signs of acute infarction. There is no evidence of intracranial mass lesion or mass effect. No abnormal extraaxial fluid collections are identified.  Old infarct again  seen involving the right posterior inferior cerebellum. Ventricles are normal in size. No other intracranial abnormality identified. No skull abnormality visualized.  IMPRESSION: No acute intracranial findings.  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   Electronically Signed   By: Earle Gell M.D.   On: 04/21/2013 18:07   Mr Jodene Nam Head Wo Contrast  04/22/2013   CLINICAL DATA:  Transient visual hallucinations.  Occipital seizure.  EXAM: MRI HEAD WITHOUT CONTRAST  MRA HEAD WITHOUT CONTRAST  TECHNIQUE: Multiplanar, multiecho pulse sequences of the brain and surrounding structures were obtained without intravenous contrast. Angiographic images of the head were obtained using MRA technique without contrast.  COMPARISON:  Head CT 04/21/2013  FINDINGS: MRI HEAD FINDINGS  Diffusion imaging shows a 1.2 cm acute infarction within the left thalamus. No other acute infarction. No evidence of mass effect or hemorrhage.  The brainstem is unremarkable. There are a few old cerebellar infarctions on the right. The cerebral hemispheres show minimal chronic small vessel change within the white matter. No evidence of mass lesion, hemorrhage, hydrocephalus or extra-axial collection. No pituitary mass. No inflammatory sinus disease. No skull or skullbase lesion.  MRA HEAD FINDINGS  The both internal carotid arteries are widely patent into the brain. The anterior and middle cerebral vessels are patent without proximal stenosis, aneurysm or vascular malformation. There is some asymmetric fat within the petrous apex on the right. The  Both vertebral arteries are patent but diminutive. The basilar artery is patent but diminutive. The left posterior cerebral artery takes a fetal origin from the left carotid and appears widely patent. The right posterior cerebral artery receive supply from the basilar tip as well as a patent posterior communicating artery. There appears to be mild atherosclerotic narrowing in that region. Both superior  cerebellar arteries are patent. Patent posterior inferior cerebellar arteries are noted on each side.  IMPRESSION: 1.2 cm acute infarction within the left thalamus. No hemorrhage or swelling.  Old small vessel infarctions in the right cerebellum. Mild chronic small-vessel change of the cerebral hemispheric white matter.  No major vessel occlusion. Posterior circulation branch vessels are diminutive and there is some atherosclerotic narrowing of the proximal right posterior cerebral artery.   Electronically Signed   By: Nelson Chimes M.D.   On: 04/22/2013 13:07   Mr Brain Wo Contrast  04/22/2013   CLINICAL DATA:  Transient visual hallucinations.  Occipital seizure.  EXAM: MRI HEAD WITHOUT CONTRAST  MRA HEAD WITHOUT CONTRAST  TECHNIQUE: Multiplanar, multiecho pulse sequences of the brain and surrounding structures were obtained without intravenous contrast. Angiographic images of the head were obtained using MRA technique without contrast.  COMPARISON:  Head CT 04/21/2013  FINDINGS: MRI HEAD FINDINGS  Diffusion imaging shows a 1.2 cm acute infarction within the left thalamus. No other acute infarction. No evidence of mass effect or hemorrhage.  The brainstem is unremarkable. There are a few old cerebellar infarctions on the right. The cerebral hemispheres show  minimal chronic small vessel change within the white matter. No evidence of mass lesion, hemorrhage, hydrocephalus or extra-axial collection. No pituitary mass. No inflammatory sinus disease. No skull or skullbase lesion.  MRA HEAD FINDINGS  The both internal carotid arteries are widely patent into the brain. The anterior and middle cerebral vessels are patent without proximal stenosis, aneurysm or vascular malformation. There is some asymmetric fat within the petrous apex on the right. The  Both vertebral arteries are patent but diminutive. The basilar artery is patent but diminutive. The left posterior cerebral artery takes a fetal origin from the left  carotid and appears widely patent. The right posterior cerebral artery receive supply from the basilar tip as well as a patent posterior communicating artery. There appears to be mild atherosclerotic narrowing in that region. Both superior cerebellar arteries are patent. Patent posterior inferior cerebellar arteries are noted on each side.  IMPRESSION: 1.2 cm acute infarction within the left thalamus. No hemorrhage or swelling.  Old small vessel infarctions in the right cerebellum. Mild chronic small-vessel change of the cerebral hemispheric white matter.  No major vessel occlusion. Posterior circulation branch vessels are diminutive and there is some atherosclerotic narrowing of the proximal right posterior cerebral artery.   Electronically Signed   By: Nelson Chimes M.D.   On: 04/22/2013 13:07   Admission HPI:  Dorothy Harmon is a 62 year old female with a PMH of DMT2 uncontrolled, HTN, HLD, Arthritis, Fibromyalgia, and IBS. She reports she had been in her normal state of health today and drove over to see a friend around 3pm. She reports after she parked she felt odd and closed the car door and sat in the car. She reports having visual disturbances similar to vivid dreams, speaking abnormal, and laughing inappropriately. She is not sure exactly how long this occurred for but believes it lasted only a few minutes (maybe 5 minutes). She subsequently went inside her friends house and stayed there an hour and a half. Her friend then drove her home, and after she relayed the story to her son he had her come to the ED to be checked out.  She reports she has not always been great about taking her DM and HTN medications and she is not sure of the names of the medications. She does report that she gets them filled at the [facility] near [facility], and that she last saw her PCP 2 months ago, at which time she was told her DM was uncontrolled.   2D Echo:  -Left ventricle: The cavity size was normal. Systolic function was  normal. The estimated ejection fraction was in the range of 60% to 65%. Wall motion was normal; there were no regional wall motion abnormalities. - Aortic valve: Trivial regurgitation. - Mitral valve: Calcified annulus. - Atrial septum: There was an atrial septal aneurysm.   Hospital Course by problem list:  1. Acute left thalamic stroke: Patient presented with vision and speech disturbances.  Etiology of symptoms unclear so neurology was consulted in ED.  Patient received single dose of Keppra for initial concern that episode was due to partial seizure secondary to hyperglycemia (BG on presentation >500).  However, MRI showed 1.2 cm acute left thalamic infarct without hemorrhage or swelling; no major vessel occlusion on MRA.  Atypical presentation for thalamic infarct, no sensory sx or aphasia. Of note, patient also has old right cerebellar infarct on CT head and MRI. Troponin x1 negative. Echo negative, results above.  Bilateral carotid dopplers negative (1-39% ICA  stenosis bilaterally).  Patient was started on  Plavix 75 mg daily per neuro recs as well as atorvastatin 40 mg daily.  She had evaluations by PT, OT, and SLP.  PT recommended no PT follow-up but 24/7 supervision; patient's son and daughter plan to manage this. OT recommended no OT follow-up.  Speech therapy thought patient was only mildly cognitively impaired (and baseline unclear); they thought she could benefit from outpatient speech therapy.  Patient was discharged on Plavix 75 mg daily (no ASA given stroke on ASA therapy).  Home health nursing and speech therapy were arranged.    2. DM2, uncontrolled- BG 517 at presentation. A1C 12.9%.  She received Lantus 10 units daily, SSI while inpatient.  Initiated metformin 500 mg BID at discharge.  She needs aggressive diabetes control as outpatient.  Diabetic testing supplies (glucometer, testing strips, etc) at home, PCP follow-up arranged for 11/14.   3. Hypertension- BPs 140s/80s, held  anti-hypertensives while inpatient for permissive hypertension.  Pt discharged back on home irbesartan 300 mg daily.   4. Hyperlipidemia- LDL 111, pt has DM2 and acute stroke thus initiated statin therapy.  Patient received atorvastatin 40 mg daily while inpatient but changed to lovastatin at discharge given financial constraints.    Discharge Vitals:   BP 154/87  Pulse 70  Temp(Src) 98 F (36.7 C) (Oral)  Resp 18  Ht 5\' 4"  (1.626 m)  Wt 197 lb 3.2 oz (89.449 kg)  BMI 33.83 kg/m2  SpO2 97%  Discharge Labs:  No results found for this or any previous visit (from the past 24 hour(s)).  Signed: Ivin Poot, MD 04/28/2013, 1:05 PM   Time Spent on Discharge: 40 minutes Services Ordered on Discharge: none Equipment Ordered on Discharge: none

## 2013-04-23 NOTE — Progress Notes (Signed)
  Date: 04/23/2013  Patient name: Dorothy Harmon  Medical record number: [id number]  Date of birth: 1950-08-17   This patient has been seen and the plan of care was discussed with the house staff. Please see their note for complete details. I concur with their findings with the following additions/corrections: Discussed case with Dr. Leonie Man. PT/OT/ST evals today. CM to look into home situation to determine if safe. Based on hx, it appears she was taking ASA at home prior to CVA, so I agree with changing to Plavix. Agree with statin therapy. She will need very close PCP follow up (I don't think she's been to see her PCP in a long time).  Discussed case with son, Fara Olden on the phone this afternoon and updated him.  Dominic Pea, DO, [facility] 04/23/2013, 1:13 PM

## 2013-04-23 NOTE — Evaluation (Signed)
Physical Therapy Evaluation Patient Details Name: Dorothy Harmon MRN: [id number] DOB: [birth year] Today's Date: 04/23/2013 Time: [id number] PT Time Calculation (min): 20 min  PT Assessment / Plan / Recommendation History of Present Illness  Pt found to have L thalamic infarct  Clinical Impression  Pt functioning near baseline from a mobility standpoint. Pt at decreased falls risk as demo'd by score of 21/24 on DGI. Pt does demonstrate cognitive deficits including executive function, short term memory deficits, and word finding difficult. Due to these cognitive deficits pt currently not safe to return home alone or resume primary caregiving responsibilities to mother.    PT Assessment  Patient needs continued PT services    Follow Up Recommendations  No PT follow up;Supervision/Assistance - 24 hour    Does the patient have the potential to tolerate intense rehabilitation      Barriers to Discharge Decreased caregiver support pt primary caregiver to mother but reports daughter and son can assist with caretaking to mother    Equipment Recommendations  None recommended by PT    Recommendations for Other Services     Frequency Min 2X/week    Precautions / Restrictions Precautions Precautions:  (high level cognition deficits) Restrictions Weight Bearing Restrictions: No   Pertinent Vitals/Pain Denies pain      Mobility  Bed Mobility Bed Mobility: Supine to Sit;Sit to Supine Supine to Sit: 7: Independent Sit to Supine: 7: Independent Transfers Transfers: Sit to Stand;Stand to Sit Sit to Stand: 5: Supervision Stand to Sit: 5: Supervision Details for Transfer Assistance: pt with safe technique Ambulation/Gait Ambulation/Gait Assistance: 5: Supervision Ambulation Distance (Feet): 300 Feet Assistive device: None Ambulation/Gait Assistance Details: no episodes of LOB, 2 episodes of cross over gait when turning to side but pt able to maintain/regain balance I'ly Gait Pattern:  Step-through pattern;Within Functional Limits Gait velocity: WFL Stairs: Yes Stairs Assistance: 5: Supervision Stairs Assistance Details (indicate cue type and reason): use handrail, up reciprocally, down step-to pattern Stair Management Technique: One rail Left Number of Stairs: 10 Modified Rankin (Stroke Patients Only) Pre-Morbid Rankin Score: No symptoms Modified Rankin: Slight disability    Exercises     PT Diagnosis: Altered mental status  PT Problem List: Decreased balance;Decreased safety awareness;Decreased cognition PT Treatment Interventions: Balance training;Neuromuscular re-education;Cognitive remediation     PT Goals(Current goals can be found in the care plan section) Acute Rehab PT Goals Patient Stated Goal: home PT Goal Formulation: With patient Time For Goal Achievement: 04/26/13 Potential to Achieve Goals: Good Additional Goals Additional Goal #1: Pt to score >52 on BERG to demo minimal falls risk.  Visit Information  Last PT Received On: 04/23/13 Assistance Needed: +1 History of Present Illness: Pt found to have L thalamic infarct       Prior [place] expects to be discharged to:: Private residence Living Arrangements: Parent Available Help at Discharge: Family;Available PRN/intermittently Type of Home: House Home Access: Stairs to enter [employer] of Steps: 3 Entrance Stairs-Rails: Left Home Layout: Two level Alternate Level Stairs-Number of Steps: 10 Alternate Level Stairs-Rails: Left Home Equipment: None  Lives With:  (mother - pt is primary caregiver) Prior Function Level of Independence: Independent Comments: works at [employer] Communication: Expressive difficulties Dominant Hand: Right    Cognition  Cognition Arousal/Alertness: Awake/alert Behavior During Therapy: Anxious Overall Cognitive Status: Impaired/Different from baseline Area of Impairment:  Memory;Safety/judgement;Awareness Safety/Judgement: Decreased awareness of deficits Awareness: Emergent General Comments: pt reports "i just can find the words or remember phone numbers"  Extremity/Trunk Assessment Upper Extremity Assessment Upper Extremity Assessment: Overall WFL for tasks assessed Lower Extremity Assessment Lower Extremity Assessment: Overall WFL for tasks assessed Cervical / Trunk Assessment Cervical / Trunk Assessment: Normal   Balance Balance Balance Assessed: Yes Standardized Balance Assessment Standardized Balance Assessment: Dynamic Gait Index Dynamic Gait Index Level Surface: Normal Change in Gait Speed: Normal Gait with Horizontal Head Turns: Mild Impairment Gait with Vertical Head Turns: Normal Gait and Pivot Turn: Normal Step Over Obstacle: Mild Impairment Step Around Obstacles: Normal Steps: Mild Impairment Total Score: 21  End of Session PT - End of Session Equipment Utilized During Treatment: Gait belt Activity Tolerance: Patient tolerated treatment well Patient left: in bed;with call bell/phone within reach Nurse Communication: Mobility status  GP     Kingsley Callander 04/23/2013, 4:41 PM  Kittie Plater, PT, DPT Pager #: [phone] Office #: [phone]

## 2013-04-23 NOTE — Evaluation (Signed)
Speech Language Pathology Evaluation Patient Details Name: Dorothy Harmon MRN: [id number] DOB: 04/17/1951 Today's Date: 04/23/2013 Time: [id number] SLP Time Calculation (min): 24 min  Problem List:  Patient Active Problem List   Diagnosis Date Noted  . Dyslipidemia 04/22/2013  . Stroke 04/21/2013  . Uncontrolled diabetes mellitus without complication [id number]  . Hypertension 04/21/2013  . Personal history of transient ischemic attack (TIA) and cerebral infarction without residual deficit 04/21/2013   Past Medical History:  Past Medical History  Diagnosis Date  . Diabetes mellitus without complication [id number]  . Hypertension   . Hypercholesteremia   . Fibromyalgia   . Arthritis   . Seizure     h/o 1 seizure    Past Surgical History:  Past Surgical History  Procedure Laterality Date  . Laparoscopic cholecystectomy  2003    Dr. Zella Richer; Chronic calculous cholecystitis and fatty infiltration of the liver  . Reconstruction of nose      cosmetic   HPI:  62 year old female with a PMH of DMT2 uncontrolled, HTN, HLD, Arthritis, Fibromyalgia, and IBS. She reports she had been in her normal state of health today and drove over to see a friend, she parked she felt odd and closed the car door and sat in the car. She reports having visual disturbances similar to vivid dreams, speaking abnormal, and laughing inappropriately. Her friend then drove her home, and after she relayed the story to her son he had her come to the ED to be checked out.  She reports she has not always been great about taking her DM and HTN medications and she is not sure of the names of the medications. She does report that she gets them filled at [facility] near [facility], and that she last saw her PCP 2 months ago, at which time she was told her DM was uncontrolled.  MRI revealed 1.2 cm acute infarction within the left thalamus. Old small vessel infarctions in the right cerebellum. Mild chronic small-vessel change of the  cerebral hemispheric white matter.  Neurology workup pt. with resultant intermittent memory loss, transient and question of pt .being discharged home alone.  Pt. is responsible for caring for her 76 year old mother who she states is "in good health" but she helps administer her meds.   Assessment / Plan / Recommendation Clinical Impression  Assessment completed exhibiting mild-moderate cognitive-linguistic deficits primarily with word finding deficits in conversational level.  She used description to compensate and SLP educated on additional strategies to utilize.  Numbers are most difficult for pt. to express and she states "I don't remember anyone's phone number" (including 911).  Current cognitive ability and reasoning/problem solving is difficult to determine due to baseline questionable decision making (not taking medications; she reported being unable to afford all of them).  Working memory is decreased for new information.  She appears apathetic to situation/decreased awareness of impairments.  Pt. needs initial 24 hour supervision and would not be safe currently returning home alone to care for herself and mother.  Prognosis is good for return to baseline.  Can social work assist with locating financial assistance programs (not enough food at the end of the month)?      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[employer])   Cognition  Overall Cognitive Status: Impaired/Different from baseline Arousal/Alertness:  Awake/alert Orientation Level: Oriented to person;Oriented to place;Oriented to situation Attention:  (suspect difficulty with divided) Memory: Impaired Memory Impairment: Storage deficit;Retrieval deficit;Decreased recall of new information;Decreased short term memory Awareness: Impaired Awareness Impairment: Intellectual impairment;Emergent impairment;Anticipatory impairment Problem Solving: Impaired Problem Solving Impairment: Verbal basic Executive Function: Self Monitoring;Self Correcting;Organizing Behaviors:  (appears somewhat apathetic) Safety/Judgment: Impaired    Comprehension  Auditory Comprehension Overall Auditory Comprehension: Appears within functional limits for tasks assessed Visual Recognition/Discrimination Discrimination: Not tested Reading Comprehension Reading Status: Not tested    Expression Expression Primary Mode of Expression: Verbal Verbal Expression Overall Verbal Expression: Impaired Initiation: No impairment Level of Generative/Spontaneous Verbalization: Conversation Naming: Impairment Responsive: 76-100% accurate Pragmatics: No impairment Written Expression Dominant Hand: Right Written Expression: Within Functional Limits   Oral / Motor Oral Motor/Sensory Function Overall Oral Motor/Sensory Function: Impaired Labial ROM: Reduced right Labial Symmetry: Abnormal symmetry right Labial Strength: Within Functional Limits Labial Sensation: Within Functional Limits Lingual ROM: Within Functional Limits Facial ROM: Reduced right Facial Symmetry: Within Functional Limits Facial Sensation: Within Functional Limits Velum: Within Functional Limits Mandible: Within Functional Limits Motor Speech Overall Motor Speech: Appears within functional limits for tasks assessed Respiration: Within functional limits Phonation: Normal Resonance: Within functional limits Articulation: Within functional limitis Intelligibility: Intelligible Motor Planning:  Witnin functional limits   GO     Orbie Pyo [employer] [phone]  04/23/2013

## 2013-04-23 NOTE — Care Management Note (Signed)
    Page 1 of 2   04/24/2013     12:20:37 PM   CARE MANAGEMENT NOTE 04/24/2013  Patient:  Dorothy Harmon, Dorothy Harmon   Account Number:  [id number]  Date Initiated:  04/23/2013  Documentation initiated by:  Marvetta Gibbons  Subjective/Objective Assessment:   Pt admitted with + MRI for CVA infarct     Action/Plan:   PTA pt lived at home with 62 yr old mother. PT/OT/ST evals pending   Anticipated DC Date:  04/24/2013   Anticipated DC Plan:  [place]  CM consult      [facility] Choice  HOME HEALTH   Choice offered to / List presented to:  C-1 Patient        [place] arranged  HH-1 RN  [place].   Status of service:  Completed, signed off Medicare Important Message given?   (If response is "NO", the following Medicare IM given date fields will be blank) Date Medicare IM given:   Date Additional Medicare IM given:    Discharge Disposition:  [place]  Per UR Regulation:  Reviewed for med. necessity/level of care/duration of stay  If discussed at [place] of Stay Meetings, dates discussed:    Comments:  04/24/13- 1130- Marvetta Gibbons RN,BSN [phone] Spoke with DR. Stann Mainland - pt will be discharged home today- orders for [facility]- RN, ST- in to speak with pt (son and daughter also at bedside) discussed recommendations per therapy evals for supervision at home - pt and family decline list for private pay agencies due to cost- feel like they can manage with intermittent supervision of pt and 69 yr old mother. Discussed medication cost- Plavix $13 at [facility] with [facility] card and others $4 - pt feels like she can handle cost- will not assist with [facility]. Info given to pt and family on the [place]- and also discussed pt f/u with Dr. Osborne Casco including out of pocket cost- pt still wants to stay with Dr. Osborne Casco as this has been her PCP- pt states that she has a  glucometer at home and test strips- she is agreeable to [facility] services- referral for [facility]- RN/ST and CSW called to Butch Penny with [facility]-    04/23/13 [place]- Marvetta Gibbons RN, BSN [phone] Referral received for d/c planning- spoke with Dr. Leonie Man this am regarding concerns for pt returning home with 28 yr old mother- due to memory issues at this time secondary to CVA- call made to pt's son Jola Baptist [phone]) per TC pt's son also has concerns about mother returning home to care for herself and 8 yr old mother. At this time son and daughter are trying to care for the grandmother- explained to son that PT/OT/ST evals are pending to help determine safe d/c plan. In to speak with pt- who states that she sees Dr. Osborne Casco but hasn't always had money for meds and therefore has not taken her meds for awhile. Pt would be eligible for assistance if needed  for meds with [facility] (30 day supply)- will follow to see what meds pt will be discharged on- awaiting PT/OT/ST evals to see best d/c plans- pt wants to d/c home but will not have 24/7 supervision per son- CSW to also see pt for resources available in community.

## 2013-04-23 NOTE — Progress Notes (Signed)
Stroke Team Progress Note  HISTORY Dorothy Harmon is a 62 y.o. female who is unrelated as a friend, and when she arrived she had a visual change. She states that she saw the same image repeated multiple times and that this vision change lasted for several minutes. Once resolve she went in to speak with her friend who said that she was acting slightly confused. She has continued to act slightly "drunk."   On arrival to the emergency department, her symptoms are mostly resolved other than feeling still slightly odd.  Last known well-unclear as she was feeling slightly uneven prior to the episode of hallucination  TPA given: No, unclear time of onset   She was admitted for further evaluation and treatment.   SUBJECTIVE She is lying in bed. Stated that yesterday that she saw "triple images".  Overall she feels her condition is resolving quickly. She states that she cannot recall phone numbers yet.      OBJECTIVE Most recent Vital Signs: Filed Vitals:   04/22/13 2000 04/23/13 0000 04/23/13 0400 04/23/13 0749  BP: 142/74 145/86 145/84 148/90  Pulse: 73 72 81 67  Temp: 98.5 F (36.9 C) 97.9 F (36.6 C) 98.6 F (37 C) 98.7 F (37.1 C)  TempSrc: Oral Oral Oral Oral  Resp: 16 16 16 16   Height:      Weight:      SpO2: 95% 96% 95% 95%   CBG (last 3)   Recent Labs  04/22/13 1646 04/22/13 2032 04/23/13 0745  GLUCAP 234* 271* 285*    IV Fluid Intake:   . sodium chloride 100 mL/hr at 04/22/13 2310    MEDICATIONS  . aspirin  81 mg Oral Daily  . atorvastatin  40 mg Oral q1800  . enoxaparin (LOVENOX) injection  40 mg Subcutaneous Q24H  . insulin aspart  0-15 Units Subcutaneous TID WC  . insulin glargine  10 Units Subcutaneous QHS   PRN:    Diet:  Carb Control thin liquids Activity:   DVT Prophylaxis:  lovenox  CLINICALLY SIGNIFICANT STUDIES Basic Metabolic Panel:  Recent Labs Lab 04/21/13 1754 04/21/13 1821 04/22/13 0535  NA 134* 138 141  K 3.8 3.8 3.5  CL 98 101 108   CO2 22  --  25  GLUCOSE 517* 517* 245*  BUN 21 23 14   CREATININE 0.74 0.90 0.59  CALCIUM 9.0  --  8.1*   Liver Function Tests:  Recent Labs Lab 04/21/13 1754  AST 14  ALT 12  ALKPHOS 99  BILITOT 0.2*  PROT 6.7  ALBUMIN 3.4*   CBC:  Recent Labs Lab 04/21/13 1754 04/21/13 1821  WBC 6.0  --   NEUTROABS 3.4  --   HGB 14.5 14.6  HCT 40.7 43.0  MCV 85.5  --   PLT 231  --    Coagulation:  Recent Labs Lab 04/21/13 1754  LABPROT 11.7  INR 0.87   Cardiac Enzymes:  Recent Labs Lab 04/21/13 1754  TROPONINI <0.30   Urinalysis:  Recent Labs Lab 04/21/13 2233  COLORURINE YELLOW  LABSPEC 1.035*  PHURINE 5.0  GLUCOSEU >1000*  HGBUR NEGATIVE  BILIRUBINUR NEGATIVE  KETONESUR NEGATIVE  PROTEINUR NEGATIVE  UROBILINOGEN 0.2  NITRITE NEGATIVE  LEUKOCYTESUR NEGATIVE   Lipid Panel    Component Value Date/Time   CHOL 200 04/22/2013 0535   TRIG 263* 04/22/2013 0535   HDL 36* 04/22/2013 0535   CHOLHDL 5.6 04/22/2013 0535   VLDL 53* 04/22/2013 0535   LDLCALC 111* 04/22/2013 0535   HgbA1C  Lab Results  Component Value Date   HGBA1C 12.9* 04/22/2013    Urine Drug Screen:     Component Value Date/Time   LABOPIA NONE DETECTED 04/22/2013 1808   COCAINSCRNUR NONE DETECTED 04/22/2013 1808   LABBENZ NONE DETECTED 04/22/2013 1808   AMPHETMU NONE DETECTED 04/22/2013 1808   THCU NONE DETECTED 04/22/2013 1808   LABBARB NONE DETECTED 04/22/2013 1808    Alcohol Level: No results found for this basename: ETH,  in the last 168 hours  Dg Chest 2 View 04/21/2013   Mild cardiomegaly and basilar atelectasis.   Ct Head (brain) Wo Contrast 04/21/2013  No acute intracranial findings.  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      Mr Jodene Nam Head Wo Contrast 04/22/2013  1.2 cm acute infarction within the left thalamus. No hemorrhage or swelling.  Old small vessel infarctions in the right cerebellum. Mild chronic small-vessel change of the cerebral hemispheric white matter.  No major vessel  occlusion. Posterior circulation branch vessels are diminutive and there is some atherosclerotic narrowing of the proximal right posterior cerebral artery.      Mr Brain Wo Contrast 04/22/2013   Diffusion imaging shows a 1.2 cm acute infarction within the left thalamus. No other acute infarction. No evidence of mass effect or hemorrhage.  The brainstem is unremarkable. There are a few old cerebellar infarctions on the right. The cerebral hemispheres show minimal chronic small vessel change within the white matter. No evidence of mass lesion, hemorrhage, hydrocephalus or extra-axial collection. No pituitary mass. No inflammatory sinus disease. No skull or skullbase lesion.    2D Echocardiogram  EF 60%, wall motion normal, LA normal in size  Carotid Doppler  Preliminary report: Bilateral: 1-39% ICA stenosis. Vertebral artery flow is antegrade.   CXR    EKG  normal sinus rhythm.   Therapy Recommendations   Physical Exam   Patient is awake, alert, oriented to person, place, month,  .  Immediate and remote memory are diminished. Recall 0/3. Poor insight into her condition.  No signs of aphasia or neglect  She does appear slightly confused, giving wrong day of the week repeatedly for doctor's appointment at his upcoming.  Cranial Nerves:  II: Visual Fields are full. Pupils are equal, round, and reactive to light. Discs are difficult to visualize.  III,IV, VI: EOMI without ptosis or diploplia.  V: Facial sensation is symmetric to temperature  VII: Facial movement is symmetric.  VIII: hearing is intact to voice  X: Uvula elevates symmetrically  XI: Shoulder shrug is symmetric.  XII: tongue is midline without atrophy or fasciculations.  Motor:  Tone is normal. Bulk is normal. 5/5 strength was present in all four extremities.  Sensory:  Sensation is symmetric to light touch and temperature in the arms and legs.  Deep Tendon Reflexes:  2+ and symmetric in the biceps and patellae.  Plantars:   Toes are downgoing bilaterally.  Cerebellar:  FNF and HKS are intact bilaterally  Gait:  Patient has a stable casual gait. Able to tandem, heel, and toe walk.    ASSESSMENT Dorothy Harmon is a 62 y.o. female presenting with visual changes (transient) along with acute delirium. Imaging confirms a left  thalamic infarct. Infarct felt to be due to small vessel disease.  On aspirin 325 mg orally every day prior to admission. Now on aspirin 81 mg orally every day for secondary stroke prevention. Patient with resultant intermittent memory loss, transiet. Work up underway.   Diabetes mellitus, type 2, uncontrolled, HGB A1C  12.9  Hyperlipidemia, LDL 111, goal < 70, needs statin  hypertension  Hospital day # 2  TREATMENT/PLAN  Change to clopidogrel 75 mg orally every day for secondary stroke prevention.  Needs to have PT/OT evals. Also, ST evaluate patient for cognitive thinking.  Recommend to add statin  Recommend to start ambulation.  Risk factor modification  Compliance encouraged.  Have patient follow up with Dr. Leonie Man in 2 months in stroke clinic.  Dr. Leonie Man spoke to son, Jola Baptist at [phone], plan of care  (daughter is Deeann Dowse  [phone]). With patient's memory issues, her capacity to make own decisions are not present and we do not think that she should be discharged to home alone.  D/W DR Murlean Caller and medical resident  Regenia Skeeter. Owens Shark, [facility], [place], [facility] Pager: [phone] 04/23/2013 7:59 AM  I have personally obtained a history, examined the patient, evaluated imaging results, and formulated the assessment and plan of care. I agree with the above. Antony Contras, MD

## 2013-04-24 LAB — GLUCOSE, CAPILLARY
Glucose-Capillary: 296 mg/dL — ABNORMAL HIGH (ref 70–99)
Glucose-Capillary: 232 mg/dL — ABNORMAL HIGH (ref 70–99)

## 2013-04-24 MED ORDER — LOVASTATIN 20 MG PO TABS: 40.0000 mg | ORAL_TABLET | Freq: Every day | ORAL | Status: DC

## 2013-04-24 MED ORDER — CLOPIDOGREL BISULFATE 75 MG PO TABS: 75.0000 mg | ORAL_TABLET | Freq: Every day | ORAL | Status: DC

## 2013-04-24 MED ORDER — METFORMIN HCL 500 MG PO TABS: 500.0000 mg | ORAL_TABLET | Freq: Two times a day (BID) | ORAL | Status: DC

## 2013-04-24 NOTE — Progress Notes (Signed)
Stroke Team Progress Note  HISTORY Dorothy Harmon is a 62 y.o. female who is unrelated as a friend, and when she arrived she had a visual change. She states that she saw the same image repeated multiple times and that this vision change lasted for several minutes. Once resolve she went in to speak with her friend who said that she was acting slightly confused. She has continued to act slightly "drunk."   On arrival to the emergency department, her symptoms are mostly resolved other than feeling still slightly odd.  Last known well-unclear as she was feeling slightly uneven prior to the episode of hallucination  TPA given: No, unclear time of onset   She was admitted for further evaluation and treatment.  SUBJECTIVE She is lying in bed. Talking with ST. She thinks the Stroke Team is only coming because her daughter told us to.   OBJECTIVE Most recent Vital Signs: Filed Vitals:   04/23/13 2000 04/24/13 0000 04/24/13 0400 04/24/13 0946  BP: 157/85 164/84 165/93 149/89  Pulse: 74 75 77 73  Temp: 98.6 F (37 C) 98.5 F (36.9 C) 98.1 F (36.7 C)   TempSrc: Oral Oral Oral   Resp: 18 18 18    Height:      Weight:      SpO2: 96% 98% 97%    CBG (last 3)   Recent Labs  04/23/13 1642 04/23/13 2040 04/24/13 0833  GLUCAP 250* 318* 296*    IV Fluid Intake:      MEDICATIONS  . atorvastatin  40 mg Oral q1800  . enoxaparin (LOVENOX) injection  40 mg Subcutaneous Q24H  . insulin aspart  0-15 Units Subcutaneous TID WC  . insulin glargine  10 Units Subcutaneous QHS  . irbesartan  300 mg Oral Daily   PRN:    Diet:  Carb Control thin liquids Activity:   DVT Prophylaxis:  lovenox  CLINICALLY SIGNIFICANT STUDIES Basic Metabolic Panel:   Recent Labs Lab 04/21/13 1754 04/21/13 1821 04/22/13 0535  NA 134* 138 141  K 3.8 3.8 3.5  CL 98 101 108  CO2 22  --  25  GLUCOSE 517* 517* 245*  BUN 21 23 14   CREATININE 0.74 0.90 0.59  CALCIUM 9.0  --  8.1*   Liver Function Tests:    Recent Labs Lab 04/21/13 1754  AST 14  ALT 12  ALKPHOS 99  BILITOT 0.2*  PROT 6.7  ALBUMIN 3.4*   CBC:   Recent Labs Lab 04/21/13 1754 04/21/13 1821  WBC 6.0  --   NEUTROABS 3.4  --   HGB 14.5 14.6  HCT 40.7 43.0  MCV 85.5  --   PLT 231  --    Coagulation:   Recent Labs Lab 04/21/13 1754  LABPROT 11.7  INR 0.87   Cardiac Enzymes:   Recent Labs Lab 04/21/13 1754  TROPONINI <0.30   Urinalysis:   Recent Labs Lab 04/21/13 2233  COLORURINE YELLOW  LABSPEC 1.035*  PHURINE 5.0  GLUCOSEU >1000*  HGBUR NEGATIVE  BILIRUBINUR NEGATIVE  KETONESUR NEGATIVE  PROTEINUR NEGATIVE  UROBILINOGEN 0.2  NITRITE NEGATIVE  LEUKOCYTESUR NEGATIVE   Lipid Panel    Component Value Date/Time   CHOL 200 04/22/2013 0535   TRIG 263* 04/22/2013 0535   HDL 36* 04/22/2013 0535   CHOLHDL 5.6 04/22/2013 0535   VLDL 53* 04/22/2013 0535   LDLCALC 111* 04/22/2013 0535   HgbA1C  Lab Results  Component Value Date   HGBA1C 12.9* 04/22/2013    Urine Drug Screen:  Component Value Date/Time   LABOPIA NONE DETECTED 04/22/2013 1808   COCAINSCRNUR NONE DETECTED 04/22/2013 1808   LABBENZ NONE DETECTED 04/22/2013 1808   AMPHETMU NONE DETECTED 04/22/2013 1808   THCU NONE DETECTED 04/22/2013 1808   LABBARB NONE DETECTED 04/22/2013 1808    Alcohol Level: No results found for this basename: ETH,  in the last 168 hours  Dg Chest 2 View 04/21/2013   Mild cardiomegaly and basilar atelectasis.   Ct Head (brain) Wo Contrast 04/21/2013  No acute intracranial findings.  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      Mr Jodene Nam Head Wo Contrast 04/22/2013  1.2 cm acute infarction within the left thalamus. No hemorrhage or swelling.  Old small vessel infarctions in the right cerebellum. Mild chronic small-vessel change of the cerebral hemispheric white matter.  No major vessel occlusion. Posterior circulation branch vessels are diminutive and there is some atherosclerotic narrowing of the proximal right  posterior cerebral artery.      Mr Brain Wo Contrast 04/22/2013   Diffusion imaging shows a 1.2 cm acute infarction within the left thalamus. No other acute infarction. No evidence of mass effect or hemorrhage.  The brainstem is unremarkable. There are a few old cerebellar infarctions on the right. The cerebral hemispheres show minimal chronic small vessel change within the white matter. No evidence of mass lesion, hemorrhage, hydrocephalus or extra-axial collection. No pituitary mass. No inflammatory sinus disease. No skull or skullbase lesion.    2D Echocardiogram  EF 60%, wall motion normal, LA normal in size  Carotid Doppler  Preliminary report: Bilateral: 1-39% ICA stenosis. Vertebral artery flow is antegrade.   CXR    EKG  normal sinus rhythm.   Therapy Recommendations   Physical Exam   Patient is awake, alert, oriented to person, place, month,  .  Immediate and remote memory are diminished. Recall 0/3. Poor insight into her condition.  No signs of aphasia or neglect  She does appear slightly confused, giving wrong day of the week repeatedly for doctor's appointment at his upcoming.  Cranial Nerves:  II: Visual Fields are full. Pupils are equal, round, and reactive to light. Discs are difficult to visualize.  III,IV, VI: EOMI without ptosis or diploplia.  V: Facial sensation is symmetric to temperature  VII: Facial movement is symmetric.  VIII: hearing is intact to voice  X: Uvula elevates symmetrically  XI: Shoulder shrug is symmetric.  XII: tongue is midline without atrophy or fasciculations.  Motor:  Tone is normal. Bulk is normal. 5/5 strength was present in all four extremities.  Sensory:  Sensation is symmetric to light touch and temperature in the arms and legs.  Deep Tendon Reflexes:  2+ and symmetric in the biceps and patellae.  Plantars:  Toes are downgoing bilaterally.  Cerebellar:  FNF and HKS are intact bilaterally  Gait:  Patient has a stable casual gait.  Able to tandem, heel, and toe walk.    ASSESSMENT [name] is a 61 y.o. female presenting with visual changes (transient) along with acute delirium. Imaging confirms a left  thalamic infarct. Infarct felt to be due to small vessel disease.  On aspirin 325 mg orally every day prior to admission. Now on clopidogrel 75 mg orally every day for secondary stroke prevention. 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 Typical memory deficits related to thalamic stroke is inability to form new memories, any baseline memory deficits should be unchanged. Work up completed.   Diabetes mellitus, type 2, uncontrolled,  HGB A1C  12.9  Hyperlipidemia, LDL 111, goal < 70, on lipitor  hypertension  Hospital day # 3  TREATMENT/PLAN  Continue  clopidogrel 75 mg orally every day for secondary stroke prevention.  ST recommends psych eval to determine competency - unsure that opinion will change the outcome as pt has to be deemed legally incompetent to keep her against her wishes.   Medication compliance encouraged. No further stroke workup indicated. Patient has a 10-15% risk of having another stroke over the next year, the highest risk is within 2 weeks of the most recent stroke/TIA (risk of having a stroke following a stroke or TIA is the same). Ongoing risk factor control by Primary Care Physician Stroke Service will sign off. Please call should any needs arise. Follow up with Dr. Clydene Fake NP in the Stroke Clinic, in 1 months.   Dr. Leonie Man spoke to daughter Junie Panning and patient. He recommends supervision. He explained the competence issue to her with her understanding. She expects for her mom to be discharged home today.  Burnetta Sabin, MSN, RN, ANVP-BC, ANP-BC, [facility] Pager: [phone] 04/24/2013 10:41 AM  I have personally obtained a history, examined the patient, evaluated imaging results, and formulated the assessment and plan of care. I  agree with the above. Antony Contras, MD

## 2013-04-24 NOTE — Progress Notes (Signed)
Inpatient Diabetes Program Recommendations  AACE/ADA: New Consensus Statement on Inpatient Glycemic Control (2013)  Target Ranges:  Prepandial:   less than 140 mg/dL      Peak postprandial:   less than 180 mg/dL (1-2 hours)      Critically ill patients:  140 - 180 mg/dL  Results for ANDALASIA, OSTRAND (MRN [id number]) as of 04/24/2013 11:32  Ref. Range 04/22/2013 05:35  Hemoglobin A1C Latest Range: <5.7 % 12.9 (H)   Results for PREESHA, NICOSIA (MRN [id number]) as of 04/24/2013 11:32  Ref. Range 04/23/2013 07:45 04/23/2013 12:04 04/23/2013 16:42 04/23/2013 20:40 04/24/2013 08:33  Glucose-Capillary Latest Range: 70-99 mg/dL 285 (H) 179 (H) 250 (H) 318 (H) 296 (H)   Inpatient Diabetes Program Recommendations Insulin - Basal: Please increase Lantus to 15 units QHS. Correction (SSI): Please increase Novolog correction scale to resistant correction scale and ADD Novolog bedtime correction.  Thanks, Barnie Alderman, RN, MSN, CCRN Diabetes Coordinator Inpatient Diabetes Program [phone] (Team Pager) [phone] ([facility] office) [phone] [facility] office)

## 2013-04-24 NOTE — Progress Notes (Signed)
Subjective: Dorothy Harmon is feeling better again this morning, asking to leave.  Alert and oriented x 3.  Speaking coherently, no pain or weakness.  Dr. Leonie Man of neurology is ok with her being discharged home today, patient and daughter in agreement.  PT, OT, ST have signed off; PT recommendation for 24 hour supervision at home but pt does not have insurance and can not pay out of pocket.  Will arrange for home health nurse and speech therapist temporarily.    Objective: Vital signs in last 24 hours: Filed Vitals:   04/23/13 1644 04/23/13 2000 04/24/13 0000 04/24/13 0400  BP: 158/95 157/85 164/84 165/93  Pulse: 71 74 75 77  Temp: 98.6 F (37 C) 98.6 F (37 C) 98.5 F (36.9 C) 98.1 F (36.7 C)  TempSrc: Oral Oral Oral Oral  Resp: 16 18 18 18   Height:      Weight:      SpO2: 96% 96% 98% 97%   Weight change:  No intake or output data in the 24 hours ending 04/24/13 0700 PEX General: alert, cooperative, and in no apparent distress HEENT: sclerae anicteric Neck: supple, no lymphadenopathy Lungs: clear to ascultation bilaterally, normal work of respiration, no wheezes, rales, ronchi Heart: regular rate and rhythm, no murmurs, gallops, or rubs Abdomen: soft, non-tender, non-distended, normal bowel sounds Extremities: 2+ DP/PT pulses bilaterally, no cyanosis, clubbing, or edema Neurologic: alert & oriented X3, cranial nerves II-XII intact, strength grossly intact, sensation intact to light touch   Lab Results: Basic Metabolic Panel:  Recent Labs Lab 04/21/13 1754 04/21/13 1821 04/22/13 0535  NA 134* 138 141  K 3.8 3.8 3.5  CL 98 101 108  CO2 22  --  25  GLUCOSE 517* 517* 245*  BUN 21 23 14   CREATININE 0.74 0.90 0.59  CALCIUM 9.0  --  8.1*   Liver Function Tests:  Recent Labs Lab 04/21/13 1754  AST 14  ALT 12  ALKPHOS 99  BILITOT 0.2*  PROT 6.7  ALBUMIN 3.4*   CBC:  Recent Labs Lab 04/21/13 1754 04/21/13 1821  WBC 6.0  --   NEUTROABS 3.4  --   HGB 14.5  14.6  HCT 40.7 43.0  MCV 85.5  --   PLT 231  --    Cardiac Enzymes:  Recent Labs Lab 04/21/13 1754  TROPONINI <0.30   CBG:  Recent Labs Lab 04/22/13 1646 04/22/13 2032 04/23/13 0745 04/23/13 1204 04/23/13 1642 04/23/13 2040  GLUCAP 234* 271* 285* 179* 250* 318*   Hemoglobin A1C:  Recent Labs Lab 04/22/13 0535  HGBA1C 12.9*   Fasting Lipid Panel:  Recent Labs Lab 04/22/13 0535  CHOL 200  HDL 36*  LDLCALC 111*  TRIG 263*  CHOLHDL 5.6   Coagulation:  Recent Labs Lab 04/21/13 1754  LABPROT 11.7  INR 0.87   Urine Drug Screen: Drugs of Abuse     Component Value Date/Time   LABOPIA NONE DETECTED 04/22/2013 1808   COCAINSCRNUR NONE DETECTED 04/22/2013 1808   LABBENZ NONE DETECTED 04/22/2013 1808   AMPHETMU NONE DETECTED 04/22/2013 1808   THCU NONE DETECTED 04/22/2013 1808   LABBARB NONE DETECTED 04/22/2013 1808    Urinalysis:  Recent Labs Lab 04/21/13 2233  COLORURINE YELLOW  LABSPEC 1.035*  PHURINE 5.0  GLUCOSEU >1000*  HGBUR NEGATIVE  BILIRUBINUR NEGATIVE  KETONESUR NEGATIVE  PROTEINUR NEGATIVE  UROBILINOGEN 0.2  NITRITE NEGATIVE  LEUKOCYTESUR NEGATIVE   Studies/Results: Mr Jodene Nam Head Wo Contrast  04/22/2013   CLINICAL DATA:  Transient visual  hallucinations.  Occipital seizure.  EXAM: MRI HEAD WITHOUT CONTRAST  MRA HEAD WITHOUT CONTRAST  TECHNIQUE: Multiplanar, multiecho pulse sequences of the brain and surrounding structures were obtained without intravenous contrast. Angiographic images of the head were obtained using MRA technique without contrast.  COMPARISON:  Head CT 04/21/2013  FINDINGS: MRI HEAD FINDINGS  Diffusion imaging shows a 1.2 cm acute infarction within the left thalamus. No other acute infarction. No evidence of mass effect or hemorrhage.  The brainstem is unremarkable. There are a few old cerebellar infarctions on the right. The cerebral hemispheres show minimal chronic small vessel change within the white matter. No evidence of mass  lesion, hemorrhage, hydrocephalus or extra-axial collection. No pituitary mass. No inflammatory sinus disease. No skull or skullbase lesion.  MRA HEAD FINDINGS  The both internal carotid arteries are widely patent into the brain. The anterior and middle cerebral vessels are patent without proximal stenosis, aneurysm or vascular malformation. There is some asymmetric fat within the petrous apex on the right. The  Both vertebral arteries are patent but diminutive. The basilar artery is patent but diminutive. The left posterior cerebral artery takes a fetal origin from the left carotid and appears widely patent. The right posterior cerebral artery receive supply from the basilar tip as well as a patent posterior communicating artery. There appears to be mild atherosclerotic narrowing in that region. Both superior cerebellar arteries are patent. Patent posterior inferior cerebellar arteries are noted on each side.  IMPRESSION: 1.2 cm acute infarction within the left thalamus. No hemorrhage or swelling.  Old small vessel infarctions in the right cerebellum. Mild chronic small-vessel change of the cerebral hemispheric white matter.  No major vessel occlusion. Posterior circulation branch vessels are diminutive and there is some atherosclerotic narrowing of the proximal right posterior cerebral artery.   Electronically Signed   By: Nelson Chimes M.D.   On: 04/22/2013 13:07   Mr Brain Wo Contrast  04/22/2013   CLINICAL DATA:  Transient visual hallucinations.  Occipital seizure.  EXAM: MRI HEAD WITHOUT CONTRAST  MRA HEAD WITHOUT CONTRAST  TECHNIQUE: Multiplanar, multiecho pulse sequences of the brain and surrounding structures were obtained without intravenous contrast. Angiographic images of the head were obtained using MRA technique without contrast.  COMPARISON:  Head CT 04/21/2013  FINDINGS: MRI HEAD FINDINGS  Diffusion imaging shows a 1.2 cm acute infarction within the left thalamus. No other acute infarction. No  evidence of mass effect or hemorrhage.  The brainstem is unremarkable. There are a few old cerebellar infarctions on the right. The cerebral hemispheres show minimal chronic small vessel change within the white matter. No evidence of mass lesion, hemorrhage, hydrocephalus or extra-axial collection. No pituitary mass. No inflammatory sinus disease. No skull or skullbase lesion.  MRA HEAD FINDINGS  The both internal carotid arteries are widely patent into the brain. The anterior and middle cerebral vessels are patent without proximal stenosis, aneurysm or vascular malformation. There is some asymmetric fat within the petrous apex on the right. The  Both vertebral arteries are patent but diminutive. The basilar artery is patent but diminutive. The left posterior cerebral artery takes a fetal origin from the left carotid and appears widely patent. The right posterior cerebral artery receive supply from the basilar tip as well as a patent posterior communicating artery. There appears to be mild atherosclerotic narrowing in that region. Both superior cerebellar arteries are patent. Patent posterior inferior cerebellar arteries are noted on each side.  IMPRESSION: 1.2 cm acute infarction within the left thalamus.  No hemorrhage or swelling.  Old small vessel infarctions in the right cerebellum. Mild chronic small-vessel change of the cerebral hemispheric white matter.  No major vessel occlusion. Posterior circulation branch vessels are diminutive and there is some atherosclerotic narrowing of the proximal right posterior cerebral artery.   Electronically Signed   By: Nelson Chimes M.D.   On: 04/22/2013 13:07   Medications: I have reviewed the patient's current medications. Scheduled Meds: . atorvastatin  40 mg Oral q1800  . enoxaparin (LOVENOX) injection  40 mg Subcutaneous Q24H  . insulin aspart  0-15 Units Subcutaneous TID WC  . insulin glargine  10 Units Subcutaneous QHS  . irbesartan  300 mg Oral Daily       Assessment/Plan: #Acute thalamic stroke- MRI showed 1.2 cm acute left thalamic infarct without hemorrhage or swelling; no major vessel occlusion on MRA.  Atypical presentation for thalamic infarct, no sensory sx or aphasia.  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  Bilateral carotid dopplers negative.  Echo negative (EF [id number], normal systolic function, normal wall motion).  Patient still with mild cognitive deficits. -neuro following, appreciate recs -continue telemetry, neuro checks q2h until discharge -continue Plavix 75 mg PO qd; pt has rescheduled her dental appointment on Friday and will let dentist know she is on Plavix even before minor procedure -discharge today per neuro (see above), care management facilitating home health nursing and speech therapy as well as help with medication cost  #DM2, uncontrolled- A1C 12.9%.  Pt has glucometer and test strips at home. -Lantus 10 units daily, SSI moderate -CBGs AC and qhs -discharge on metformin 500 mg PO BID with close PCP follow-up  #HTN- BP 140s/80s -restart irbesartan   #HL- LDL 111, pt has DM2 and acute stroke thus will initiate statin therapy -continue Lipitor 40 mg daily, will discharge patient on lovastatin given financial concerns  DVT PPX- Lovenox   Dispo: Disposition is deferred at this time, awaiting improvement of current medical problems.  Anticipated discharge today.  The patient does have a current PCP Haywood Pao, MD) and does need an [facility] hospital follow-up appointment after discharge.   .Services Needed at time of discharge: Y = Yes, Blank = No PT:   OT:   RN:   Equipment:   Other:     LOS: 3 days   Ivin Poot, MD 04/24/2013, 7:00 AM

## 2013-04-24 NOTE — Progress Notes (Signed)
  Date: 04/24/2013  Patient name: Dorothy Harmon  Medical record number: [id number]  Date of birth: November 22, 1950   This patient has been seen and the plan of care was discussed with the house staff. Please see their note for complete details. I concur with their findings with the following additions/corrections: She is fully oriented on my exam. Speech is without difficulty. She was ambulating in her room without difficulty. She wants to go home. Discussed current recommendations with her and her daughter in the room. She understands her inpatient diagnosis (CVA) and understands the need to control her DM. Would start metformin 500 mg po bid and have her follow up with her pcp to consider insulin therapy. She needs aggressive diabetes control, she understands and will discuss this with her PCP. She will need diabetic testing supplies. Needs F/U with PCP in one to two weeks. Plavix instead of ASA. Outpatient ST. In my opinion, she has mental capacity to make decisions (competency is a legal decision that can be made by a judge, but only if she is medically found to have no mental capacity).  If ok with Neuro, D/C home today with supervision as recommended by neurology.  Dominic Pea, DO, [facility] 04/24/2013, 2:05 PM

## 2013-04-24 NOTE — Progress Notes (Signed)
Speech Language Pathology Patient Details Name: Dorothy Harmon MRN: [id number] DOB: [birth year] Today's Date: 04/24/2013 Time:  -    Discussed pt.'s disposition with resident Dr. Lilia Pro. SLP recommends pt. Receive follow up assessment and treatment at outpatient to determine her current status and need for continued cognitive-linguistic services (although she may be close or at baseline ?).  SLP is however, concerned with pt. Being responsible for self in addition to her 61 year old mother.  Pt.'s baseline status, reasoning and decision making is difficult to determine as she has exhibited non compliance with meds (states she could not afford).  MD will ask psych to assess for capacity which this SLP suspects she has.      Orbie Pyo [place].[employer] [phone]  04/24/2013

## 2013-04-24 NOTE — Evaluation (Signed)
Occupational Therapy Evaluation Patient Details Name: Dorothy Harmon MRN: [id number] DOB: [birth year] Today's Date: 04/24/2013 Time: 1030-1100 OT Time Calculation (min): 30 min  OT Assessment / Plan / Recommendation History of present illness Pt found to have L thalamic infarct   Clinical Impression   Pt currently demonstrates independence with all basic selfcare tasks and functional transfers.  She is also able to verbally sequence a simple meal task as well demonstrating awareness of her memory deficits.  Feel pt may have some difficulty with dividing her attention between multiple tasks so have encouraged her to have assistance at discharge from her children and only use the microwave if she is alone for periods of time just to be safe.  I also encouraged her to start using a memory notebook to write down important events and daily schedules to help with her memory.  Feel she does not warrant any further acute or post acute OT needs.  Would recommend initial 24 hour supervision for safety to see how she functions in her familiar environment.      OT Assessment  Patient does not need any further OT services    Follow Up Recommendations  No OT follow up       Equipment Recommendations  None recommended by OT          Precautions / Restrictions Precautions Precautions: None Restrictions Weight Bearing Restrictions: No   Pertinent Vitals/Pain Pt with no report of pain    ADL  Eating/Feeding: Simulated;Independent Where Assessed - Eating/Feeding: Chair Grooming: Simulated;Independent Where Assessed - Grooming: Unsupported standing Upper Body Bathing: Simulated;Independent Where Assessed - Upper Body Bathing: Unsupported standing Lower Body Bathing: Simulated;Independent Where Assessed - Lower Body Bathing: Unsupported standing Upper Body Dressing: Simulated;Independent Where Assessed - Upper Body Dressing: Unsupported sit to stand Lower Body Dressing:  Simulated;Independent Where Assessed - Lower Body Dressing: Unsupported sit to stand Toilet Transfer: Simulated;Independent Science writer: Regular height toilet Toileting - Clothing Manipulation and Hygiene: Simulated;Independent Tub/Shower Transfer: Independent;Simulated Clinical cytogeneticist Method: Ambulating Transfers/Ambulation Related to ADLs: Pt is currently independent with mobility without use of assistive device. ADL Comments: Pt is currently independent with all simulated selfcare tasks.  She was able to verbally sequence through the steps of a simple meal prep, including turning on and off the stove without difficulty.  Feel she will need iniital supervision for safety and possibly money managegemt just to be safe.  Her biggest impairment seems to be short term memory, which she is aware of.  Her son and daughter plan to provide initial PRN supervision and help provide assistance with looking after the pt's mother, who also resides in the home.        Visit Information  Last OT Received On: 04/24/13 Assistance Needed: +1 History of Present Illness: Pt found to have L thalamic infarct       Prior [place] expects to be discharged to:: Private residence Living Arrangements: Parent Available Help at Discharge: Family;Available PRN/intermittently Type of Home: House Home Access: Stairs to enter [employer] of Steps: 3 Entrance Stairs-Rails: Left Home Layout: Two level Alternate Level Stairs-Number of Steps: 10 Alternate Level Stairs-Rails: Left Home Equipment: None  Lives With:  (mother - pt is primary caregiver) Prior Function Level of Independence: Independent Comments: works at [employer] Communication: Expressive difficulties Dominant Hand: Right         Vision/Perception Vision - History Baseline Vision: Wears glasses only for reading Patient Visual Report: No change from  baseline Vision - Assessment Eye Alignment: Within Functional Limits Vision Assessment: Vision not tested Perception Perception: Within Functional Limits Praxis Praxis: Intact   Cognition  Cognition Arousal/Alertness: Awake/alert Behavior During Therapy: Anxious Overall Cognitive Status: Impaired/Different from baseline Area of Impairment: Memory;Safety/judgement;Awareness Memory: Decreased short-term memory Awareness: Intellectual General Comments: Pt reports difficulty remembering phone numbers and was only able to state 1/3 word recall after 5 minute delay.      Extremity/Trunk Assessment Upper Extremity Assessment Upper Extremity Assessment: Overall WFL for tasks assessed Lower Extremity Assessment Lower Extremity Assessment: Overall WFL for tasks assessed Cervical / Trunk Assessment Cervical / Trunk Assessment: Normal     Mobility Bed Mobility Bed Mobility: Supine to Sit;Sit to Supine Supine to Sit: 7: Independent Sit to Supine: 7: Independent Transfers Transfers: Sit to Stand Sit to Stand: 7: Independent Stand to Sit: 7: Independent        Balance Balance Balance Assessed: Yes Dynamic Standing Balance Dynamic Standing - Balance Support: No upper extremity supported Dynamic Standing - Level of Assistance: 7: Independent   End of Session OT - End of Session Activity Tolerance: Patient tolerated treatment well Patient left: in bed;with call bell/phone within reach;with family/visitor present     Rudy Domek OTR/L 04/24/2013, 12:38 PM

## 2013-04-28 NOTE — Discharge Summary (Signed)
  Date: 04/28/2013  Patient name: Dorothy Harmon  Medical record number: [id number]  Date of birth: 1950/11/18   This patient has been seen and the plan of care was discussed with the house staff. Please see their note for complete details. I concur with their findings and plan.  Dominic Pea, DO, [facility] 04/28/2013, 11:19 PM

## 2013-05-02 NOTE — ED Provider Notes (Signed)
I saw and evaluated the patient, reviewed the resident's note and I agree with the findings and plan.  EKG Interpretation     Ventricular Rate:  70 PR Interval:  156 QRS Duration: 98 QT Interval:  428 QTC Calculation: 462 R Axis:   -24 Text Interpretation:  Sinus rhythm Inferior infarct, old ED PHYSICIAN INTERPRETATION AVAILABLE IN [place]            62yF with slurred speech. Made code stroke because of this. Also mild confusions and visual changes. Dysarthria and other symptoms currently resolved. Neuro exam nonfocal. CT head neg. Significant hyperglycemia which may contribute at least partially to some of symptoms although pt denies ever having symptoms like this 2/2 hyperglycemia. Admit.   Virgel Manifold, MD 05/02/13 1034

## 2013-05-12 ENCOUNTER — Encounter (HOSPITAL_COMMUNITY): Payer: Self-pay | Admitting: Emergency Medicine

## 2013-05-12 ENCOUNTER — Emergency Department (HOSPITAL_COMMUNITY)
Admission: EM | Admit: 2013-05-12 | Discharge: 2013-05-12 | Disposition: A | Discharge status: Home / Self Care | Payer: Self-pay | Attending: Emergency Medicine | Admitting: Emergency Medicine

## 2013-05-12 DIAGNOSIS — IMO0001 Reserved for inherently not codable concepts without codable children: Secondary | ICD-10-CM | POA: Insufficient documentation

## 2013-05-12 DIAGNOSIS — Z79899 Other long term (current) drug therapy: Secondary | ICD-10-CM | POA: Insufficient documentation

## 2013-05-12 DIAGNOSIS — Z711 Person with feared health complaint in whom no diagnosis is made: Secondary | ICD-10-CM | POA: Insufficient documentation

## 2013-05-12 DIAGNOSIS — E7800 Pure hypercholesterolemia, unspecified: Secondary | ICD-10-CM | POA: Insufficient documentation

## 2013-05-12 DIAGNOSIS — Z880 Allergy status to penicillin: Secondary | ICD-10-CM | POA: Insufficient documentation

## 2013-05-12 DIAGNOSIS — E119 Type 2 diabetes mellitus without complications: Secondary | ICD-10-CM | POA: Insufficient documentation

## 2013-05-12 DIAGNOSIS — Z0000 Encounter for general adult medical examination without abnormal findings: Secondary | ICD-10-CM

## 2013-05-12 DIAGNOSIS — I10 Essential (primary) hypertension: Secondary | ICD-10-CM | POA: Insufficient documentation

## 2013-05-12 DIAGNOSIS — M129 Arthropathy, unspecified: Secondary | ICD-10-CM | POA: Insufficient documentation

## 2013-05-12 LAB — GLUCOSE, CAPILLARY

## 2013-05-12 NOTE — ED Notes (Signed)
Bobby from TTS called magistrate Alexander. He stated that IVC paperwork did not need to be faxed, pt could be discharged. MD notified.

## 2013-05-12 NOTE — [facility] Counselor (Signed)
Secretary asked about faxing release of IVC by MD to [employer].  Advice worker and spoke with Sheppard Coil.  Sheppard Coil stated MD could not reverse his decision as Magistrate but if after evaluation MD determined pt could leave ED the MD could do so.  Sheppard Coil confirmed nothing needed to be faxed and as far as he knew nothing needed to filled out.  However, MD completed the form to release pt from IVC and it will be in chart along with Affiidavit and other medical paper work.

## 2013-05-12 NOTE — ED Notes (Signed)
Pt here with gpd with IVC paperwork.Officers believe pt does not need to be here. Pt states that daughter took out papers on her due to an argument. Pt states that daughter has a long psych history and is unstable. Son at bedside, confirming. Denies SI/HI. Denies AH/VH.

## 2013-05-12 NOTE — ED Provider Notes (Signed)
CSN: [id number]     Arrival date & time 05/12/13  1246 History   First MD Initiated Contact with Patient 05/12/13 1307     Chief Complaint  Patient presents with  . Medical Clearance   (Consider location/radiation/quality/duration/timing/severity/associated sxs/prior Treatment) HPI Comments: Patient brought in by police with IVC her daughter. Patient's daughter stated patient was making threats of suicide. 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 Patient's son is with her and states that his mother has been doing fine. She had a stroke earlier in November and has been recovering well. Patient adamantly denies any suicidal ideation or homicidal ideation. Denies any hallucinations. She denies any drug or alcohol abuse. She states she has some memory problems left over from her stroke but no other weakness, numbness or tingling. She endorses good by mouth intake and urine output. Normal sleep patterns. No history of depression or anxiety.  The history is provided by the patient and the police.    Past Medical History  Diagnosis Date  . Diabetes mellitus without complication [id number]  . Hypertension   . Hypercholesteremia   . Fibromyalgia   . Arthritis   . Seizure     h/o 1 seizure    Past Surgical History  Procedure Laterality Date  . Laparoscopic cholecystectomy  2003    Dr. Zella Richer; Chronic calculous cholecystitis and fatty infiltration of the liver  . Reconstruction of nose      cosmetic   Family History  Problem Relation Age of Onset  . Heart attack Father   . Diabetes Mother     alive, 69yo (88)  . Hypertension Mother    History  Substance Use Topics  . Smoking status: Never Smoker   . Smokeless tobacco: Never Used  . Alcohol Use: No   OB History   Grav Para Term Preterm Abortions TAB SAB Ect Mult Living                 Review of Systems  Constitutional: Negative for fever, activity change and appetite change.  Respiratory: Negative for cough, chest  tightness and shortness of breath.   Cardiovascular: Negative for chest pain.  Gastrointestinal: Negative for nausea, vomiting and abdominal pain.  Genitourinary: Negative for dysuria and hematuria.  Musculoskeletal: Negative for back pain.  Skin: Negative for pallor and rash.  Neurological: Negative for dizziness, weakness and headaches.  A complete 10 system review of systems was obtained and all systems are negative except as noted in the HPI and PMH.    Allergies  Morphine and related and Penicillins  Home Medications   Current Outpatient Rx  Name  Route  Sig  Dispense  Refill  . clopidogrel (PLAVIX) 75 MG tablet   Oral   Take 1 tablet (75 mg total) by mouth daily with breakfast.   30 tablet   2   . irbesartan (AVAPRO) 300 MG tablet   Oral   Take 300 mg by mouth every morning.          . lovastatin (MEVACOR) 20 MG tablet   Oral   Take 2 tablets (40 mg total) by mouth at bedtime.   60 tablet   2   . metFORMIN (GLUCOPHAGE) 500 MG tablet   Oral   Take 1 tablet (500 mg total) by mouth 2 (two) times daily with a meal.   20 tablet   0    BP 153/95  Pulse 109  Temp(Src) 98.2 F (36.8 C) (Oral)  Resp 20  SpO2 99%  Physical Exam  Constitutional: She is oriented to person, place, and time. She appears well-developed and well-nourished. No distress.  HENT:  Head: Normocephalic and atraumatic.  Mouth/Throat: Oropharynx is clear and moist. No oropharyngeal exudate.  Eyes: Conjunctivae and EOM are normal. Pupils are equal, round, and reactive to light.  Neck: Normal range of motion. Neck supple.  Cardiovascular: Normal rate, regular rhythm and normal heart sounds.   Pulmonary/Chest: Effort normal and breath sounds normal. No respiratory distress.  Abdominal: Soft. There is no tenderness. There is no rebound and no guarding.  Musculoskeletal: Normal range of motion. She exhibits no edema and no tenderness.  Neurological: She is alert and oriented to person, place, and  time. No cranial nerve deficit. She exhibits normal muscle tone. Coordination normal.  CN 2-12 intact, no ataxia on finger to nose, no nystagmus, 5/5 strength throughout, no pronator drift, Romberg negative, normal gait.   Skin: Skin is warm.    ED Course  Procedures (including critical care time) Labs Review Labs Reviewed  GLUCOSE, CAPILLARY - Abnormal; Notable for the following:    Glucose-Capillary 172 (*)    All other components within normal limits   Imaging Review No results found.  EKG Interpretation   None       MDM   1. Normal physical exam    Patient brought in by police after her daughter filled out IVC paperwork. Patient states this daughter did this vindictively and patient's son agrees. Patient is alert and oriented x3. She denies any suicidal ideation or homicidal ideation. No hallucinations. No alcohol or drug use. No access to weapons.   Recent admission for CVA with some residual memory deficits.  No motor or sensory deficits.  Patient does not appear to be a threat to herself or others. She is calm and cooperative.  Her son states she has had no issues with depression or anxiety. Daughter who filled out the paperwork has her own psychiatric problems and is a drug abuser.  IVC paperwork rescinded and patient appears stable for discharge.  Ezequiel Essex, MD 05/12/13 [phone]

## 2013-05-28 ENCOUNTER — Encounter: Payer: Self-pay | Admitting: Neurology

## 2013-05-28 ENCOUNTER — Ambulatory Visit (INDEPENDENT_AMBULATORY_CARE_PROVIDER_SITE_OTHER): Payer: Self-pay | Admitting: Neurology

## 2013-05-28 ENCOUNTER — Encounter (INDEPENDENT_AMBULATORY_CARE_PROVIDER_SITE_OTHER): Payer: Self-pay

## 2013-05-28 VITALS — BP 140/79 | HR 95 | Ht 64.0 in | Wt 181.0 lb

## 2013-05-28 DIAGNOSIS — E7849 Other hyperlipidemia: Secondary | ICD-10-CM

## 2013-05-28 DIAGNOSIS — E782 Mixed hyperlipidemia: Secondary | ICD-10-CM

## 2013-05-28 NOTE — Patient Instructions (Signed)
Continue clopidogrel for secondary stroke prevention and strict control of diabetes with hemoglobin A1c goal below 6.5%, hypertension with blood pressure goal below 130/90 and lipids her LDL cholesterol goal below 70 mg percent. Check fasting lipid profile. Follow up with her primary physician Dr. Osborne Casco. No scheduled followup with me as she has no health insurance but can be referred back in the future as necessary.

## 2013-05-28 NOTE — Progress Notes (Signed)
[facility] [address] [place]. [place] [address] [phone]       OFFICE FOLLOW-UP NOTE  [name] Date of Birth:  04-22-1951 Medical Record Number:  [id number]   HPI: 42 year Caucasian lady seen today for the first of his follow up visit for hospital admission for stroke on 04/21/13. She presented with confusion, speech difficulty, some gait ataxia as well as visual difficulties and repeated multiple images of the same object. Symptoms were transient and mostly resolved by the time she presented to the emergency room. She had an CT scan of the head which was unremarkable an MRI scan subsequently showed a small left thalamic lacunar infarct. There were a few old cerebellar infarcts on the right which are small. MRA of the brain showed no large vessel occlusion. Carotid Doppler showed no significant extracranial stenosis. Transthoracic echo showed normal ejection fraction. Hemoglobin A1c was elevated at 12.9%. Urine drug screen was negative. Total cholesterol was elevated at 200, triglycerides 263, HDL was low at 36 and LDL was elevated at 111 mg percent. Patient was started Hohn clopidogrel 75 and then daily as well as protocol for her lipids and advised aggressive blood pressure and her cholesterol and diabetes control. She states that her recent discharge her vision difficulties and speech problems have completely resolved though she does have some mild short-term memory difficulty and has trouble remembering names. She is tolerating clopidogrel well without significant bleeding and only occasional minor bruising. She states her blood pressure overall has been quite well controlled. She states her sugars are better but still the fasting sugar ranges in the 140s and postprandial and that low 200s. She was advised to take Prilosec 40 mg which she has been doing but her primary physician has called in a new prescription for 20 mg and she wants clarification for this. She  wants to go back to work and I see no reason why she cannot as she has minimum short-term memory deficits only.  ROS:   14 system review of systems is positive for memory loss and difficulty remembering names only and all the systems are negative. PMH:  Past Medical History  Diagnosis Date  . Diabetes mellitus without complication [id number]  . Hypertension   . Hypercholesteremia   . Fibromyalgia   . Arthritis   . Seizure     h/o 1 seizure     Social History:  History   Social History  . Marital Status: Legally Separated    Spouse Name: N/A    Number of Children: 2  . Years of Education: 12   Occupational History  . customer service     [employer]   Social History Main Topics  . Smoking status: Never Smoker   . Smokeless tobacco: Never Used  . Alcohol Use: No  . Drug Use: No  . Sexual Activity: Not on file   Other Topics Concern  . Not on file   Social History Narrative   Mother lives with her; divorced   Retired    Education   Right handed.   Caffeine- None     Medications:   Current Outpatient Prescriptions on File Prior to Visit  Medication Sig Dispense Refill  . clopidogrel (PLAVIX) 75 MG tablet Take 1 tablet (75 mg total) by mouth daily with breakfast.  30 tablet  2  . irbesartan (AVAPRO) 300 MG tablet Take 300 mg by mouth every morning.       . lovastatin (MEVACOR) 20 MG tablet  Take 2 tablets (40 mg total) by mouth at bedtime.  60 tablet  2  . metFORMIN (GLUCOPHAGE) 500 MG tablet Take 1 tablet (500 mg total) by mouth 2 (two) times daily with a meal.  20 tablet  0   No current facility-administered medications on file prior to visit.    Allergies:   Allergies  Allergen Reactions  . Morphine And Related Other (See Comments)    Loss of consciousness, feeling sick, faint  . Penicillins Itching and Swelling    Physical Exam General: well developed, well nourished, seated, in no evident distress Head: head normocephalic and atraumatic. Orohparynx  benign Neck: supple with no carotid or supraclavicular bruits Cardiovascular: regular rate and rhythm, no murmurs Musculoskeletal: no deformity Skin:  no rash/petichiae Vascular:  Normal pulses all extremities Filed Vitals:   05/28/13 1445  BP: 140/79  Pulse: 95    Neurologic Exam Mental Status: Awake and fully alert. Oriented to place and time. Recent and remote memory intact. Attention span, concentration and fund of knowledge appropriate. Mood and affect appropriate. Recall diminished 0/3. Animal naming test 13 which is normal. Cranial Nerves: Fundoscopic exam reveals sharp disc margins. Pupils equal, briskly reactive to light. Extraocular movements full without nystagmus. Visual fields full to confrontation. Hearing intact. Facial sensation intact. Face, tongue, palate moves normally and symmetrically.  Motor: Normal bulk and tone. Normal strength in all tested extremity muscles. Sensory.: intact to touch and pinprick and vibratory sensation.  Coordination: Rapid alternating movements normal in all extremities. Finger-to-nose and heel-to-shin performed accurately bilaterally. Gait and Station: Arises from chair without difficulty. Stance is normal. Gait demonstrates normal stride length and balance . Able to heel, toe and tandem walk without difficulty.  Reflexes: 1+ and symmetric. Toes downgoing.   NIHSS  0 Modified Rankin  1   ASSESSMENT: 27 year Caucasian lady with left thalamic lacunar infarct in November 2014  secondary to small vessel disease with risk factors of uncontrolled diabetes, hypertension and hyperlipidemia.    PLAN: I had a long discussion with the patient with regards to her stroke symptoms, results of evaluation in the hospital, stroke risk of recurrence, secondary stroke prevention and answered questions Continue clopidogrel for secondary stroke prevention and strict control of diabetes with hemoglobin A1c goal below 6.5%, hypertension with blood pressure goal  below 130/90 and lipids her LDL cholesterol goal below 70 mg percent. Check fasting lipid profile. Follow up with her primary physician Dr. Osborne Casco. No scheduled followup with me as she has no health insurance but can be referred back in the future as necessary.

## 2013-07-02 ENCOUNTER — Ambulatory Visit: Payer: Self-pay | Admitting: Neurology

## 2015-09-17 ENCOUNTER — Emergency Department (HOSPITAL_COMMUNITY)
Admission: EM | Admit: 2015-09-17 | Discharge: 2015-09-18 | Disposition: A | Discharge status: Home / Self Care | Payer: Self-pay | Attending: Emergency Medicine | Admitting: Emergency Medicine

## 2015-09-17 ENCOUNTER — Encounter (HOSPITAL_COMMUNITY): Payer: Self-pay | Admitting: *Deleted

## 2015-09-17 DIAGNOSIS — Z79899 Other long term (current) drug therapy: Secondary | ICD-10-CM | POA: Insufficient documentation

## 2015-09-17 DIAGNOSIS — Z7902 Long term (current) use of antithrombotics/antiplatelets: Secondary | ICD-10-CM | POA: Insufficient documentation

## 2015-09-17 DIAGNOSIS — Y9389 Activity, other specified: Secondary | ICD-10-CM | POA: Insufficient documentation

## 2015-09-17 DIAGNOSIS — E119 Type 2 diabetes mellitus without complications: Secondary | ICD-10-CM | POA: Insufficient documentation

## 2015-09-17 DIAGNOSIS — T466X1A Poisoning by antihyperlipidemic and antiarteriosclerotic drugs, accidental (unintentional), initial encounter: Secondary | ICD-10-CM | POA: Insufficient documentation

## 2015-09-17 DIAGNOSIS — T464X1A Poisoning by angiotensin-converting-enzyme inhibitors, accidental (unintentional), initial encounter: Secondary | ICD-10-CM | POA: Insufficient documentation

## 2015-09-17 DIAGNOSIS — T50901A Poisoning by unspecified drugs, medicaments and biological substances, accidental (unintentional), initial encounter: Secondary | ICD-10-CM

## 2015-09-17 DIAGNOSIS — Z880 Allergy status to penicillin: Secondary | ICD-10-CM | POA: Insufficient documentation

## 2015-09-17 DIAGNOSIS — Z7984 Long term (current) use of oral hypoglycemic drugs: Secondary | ICD-10-CM | POA: Insufficient documentation

## 2015-09-17 DIAGNOSIS — E7800 Pure hypercholesterolemia, unspecified: Secondary | ICD-10-CM | POA: Insufficient documentation

## 2015-09-17 DIAGNOSIS — T45521A Poisoning by antithrombotic drugs, accidental (unintentional), initial encounter: Secondary | ICD-10-CM | POA: Insufficient documentation

## 2015-09-17 DIAGNOSIS — Z8739 Personal history of other diseases of the musculoskeletal system and connective tissue: Secondary | ICD-10-CM | POA: Insufficient documentation

## 2015-09-17 DIAGNOSIS — Y9289 Other specified places as the place of occurrence of the external cause: Secondary | ICD-10-CM | POA: Insufficient documentation

## 2015-09-17 DIAGNOSIS — Y998 Other external cause status: Secondary | ICD-10-CM | POA: Insufficient documentation

## 2015-09-17 DIAGNOSIS — I10 Essential (primary) hypertension: Secondary | ICD-10-CM | POA: Insufficient documentation

## 2015-09-17 NOTE — ED Notes (Addendum)
Pt here as she took an additional dose of 2 meds on accident this pm. Pt is here for evaluation after she took her daily dose of 75mg  plavix and 300mg  dose of Irbesartan late today (around 3pm) and on accident took her am dose again this pm around 10:15pm.  Pt was just concerned that it was too much.  Pt is a little worked up as she is worried and has had a stressful week.  BP elevated in triage

## 2015-09-17 NOTE — ED Provider Notes (Signed)
CSN: [id number]     Arrival date & time 09/17/15  2256 History  By signing my name below, I, Hansel Feinstein, attest that this documentation has been prepared under the direction and in the presence of Edina Winningham, PA-C. Electronically Signed: Hansel Feinstein, ED Scribe. 09/17/2015. 12:53 AM.    Chief Complaint  Patient presents with  . Drug Overdose    Pt took her am meds Plavix 75mg  and Irbesartan 300mg  this pm on accident   The history is provided by the patient. No language interpreter was used.   HPI Comments: Dorothy Harmon is a 65 y.o. female with h/o DM, HTN, hypercholesterolemia, fibromyalgia who presents to the Emergency Department complaining of accidental double dose of BP medications today. Pt states she took her 300 mg irbesartan, 75 mg clopidogrel and 20 mg lovastatin as prescribed at 3PM and then accidentally took them again at 10PM this evening. Pt keeps her pills in a color coded pill organizer for day and night and accidentally took her night-time pills from the wrong side. She states she came to the ED because she wanted to get checked out. Currently her only complaint is feeling somewhat "woozy". Per family member, she believes the pt is not "acting like herself" and seems "woozy". Pt attributes this to being tired and states she currently feels well. Denies HA, emesis, nausea, abdominal pain, dizziness, hearing loss, SOB, neuro deficits, any pain, additional complaints.   Past Medical History  Diagnosis Date  . Diabetes mellitus without complication (HCC) [id number]  . Hypertension   . Hypercholesteremia   . Fibromyalgia   . Arthritis   . Seizure [facility])     h/o 1 seizure    Past Surgical History  Procedure Laterality Date  . Laparoscopic cholecystectomy  2003    Dr. Zella Richer; Chronic calculous cholecystitis and fatty infiltration of the liver  . Reconstruction of nose      cosmetic   Family History  Problem Relation Age of Onset  . Heart attack Father   . Diabetes Mother    alive, 7yo (52)  . Hypertension Mother    Social History  Substance Use Topics  . Smoking status: Never Smoker   . Smokeless tobacco: Never Used  . Alcohol Use: No   OB History    No data available     Review of Systems  Constitutional:       Took an extra dose of her medications.  HENT: Negative for hearing loss.   Respiratory: Negative for shortness of breath.   Gastrointestinal: Negative for nausea, vomiting and abdominal pain.  Neurological: Negative for dizziness, syncope and headaches.  All other systems reviewed and are negative.  Allergies  Morphine and related and Penicillins  Home Medications   Prior to Admission medications   Medication Sig Start Date End Date Taking? Authorizing Provider  clopidogrel (PLAVIX) 75 MG tablet Take 1 tablet (75 mg total) by mouth daily with breakfast. 04/24/13   Effie Berkshire, MD  irbesartan (AVAPRO) 300 MG tablet Take 300 mg by mouth every morning.     Historical Provider, MD  lovastatin (MEVACOR) 20 MG tablet Take 2 tablets (40 mg total) by mouth at bedtime. 04/24/13   Effie Berkshire, MD  metFORMIN (GLUCOPHAGE) 500 MG tablet Take 1 tablet (500 mg total) by mouth 2 (two) times daily with a meal. 04/24/13   Effie Berkshire, MD   BP 171/87 mmHg  Pulse 91  Temp(Src) 98 F (36.7 C) (Oral)  Resp 16  Wt  90.719 kg  SpO2 99% Physical Exam  Constitutional: She is oriented to person, place, and time. She appears well-developed and well-nourished. No distress.  HENT:  Head: Normocephalic and atraumatic.  Eyes: Conjunctivae and EOM are normal. Pupils are equal, round, and reactive to light.  Neck: Normal range of motion. Neck supple.  Cardiovascular: Normal rate, regular rhythm, normal heart sounds and intact distal pulses.   Pulmonary/Chest: Effort normal and breath sounds normal. No respiratory distress.  Abdominal: Soft. She exhibits no distension. There is no tenderness. There is no guarding.  Musculoskeletal: Normal range of  motion. She exhibits no edema or tenderness.  Lymphadenopathy:    She has no cervical adenopathy.  Neurological: She is alert and oriented to person, place, and time. She has normal reflexes.  Cranial nerves 2-12 intact. Normal finger to nose testing. Strength and sensation equal and intact bilaterally throughout the upper and lower extremities.Normal gait. Coordination intact.   Skin: Skin is warm and dry. She is not diaphoretic.  Psychiatric: She has a normal mood and affect. Her behavior is normal.  Nursing note and vitals reviewed.   ED Course  Procedures (including critical care time) DIAGNOSTIC STUDIES: Oxygen Saturation is 96% on RA, adequate by my interpretation.    COORDINATION OF CARE: 11:38 PM Discussed treatment plan with pt at bedside which includes home management and pt agreed to plan.    MDM   Final diagnoses:  Medication administered in error, accidental or unintentional, initial encounter    Dorothy Harmon presents to the ED for evaluation of accidental double dose of her Plavix and BP medication. Pt took her 300 mg irbesartan, 75 mg clopidogrel and 20 mg lovastatin as prescribed at 3PM and then accidentally took her dose of irbesartan and plavix, meant for tomorrow morning, at 10PM this evening. No neuro deficits on exam. Pt states she feels well. BP on triage was 190/114, on recheck was 174/96. Pt will be monitored in the ED for BP checks. No adverse changes to the patient's BP while here in the ED. No additional complaints voiced by patient. Patient's family members now state that they think the pt is now acting normally. Pt was advised to continue her medication regimen as prescribed tomorrow morning. Patient also advised to follow up with PCP as needed, or with worsening symptoms. Patient appears stable for discharge at this time. Return precautions discussed and outlined in discharge paperwork. Patient is agreeable to plan.   Findings and plan of care discussed with  Orpah Greek, MD.  Danley Danker Vitals:   09/17/15 2309 09/17/15 2312 09/18/15 0010 09/18/15 0043  BP: 190/114 174/96 174/100 171/87  Pulse: 91  91 91  Temp: 98 F (36.7 C)     TempSrc: Oral     Resp: 20  16 16   Weight: 90.719 kg     SpO2: 96%  100% 99%       I personally performed the services described in this documentation, which was scribed in my presence. The recorded information has been reviewed and is accurate.   Lorayne Bender, PA-C 09/18/15 0056  Orpah Greek, MD 09/18/15 [phone]

## 2015-09-17 NOTE — ED Notes (Signed)
Shawn, PA  at bedside.

## 2015-09-18 NOTE — Discharge Instructions (Signed)
You have been seen today for evaluation after accidentally taking an extra dose of your Plavix and Irbesartan. Your blood pressure shows no dangerous changes. Resume your regular medication regimen as you normally would tomorrow morning. Follow up with PCP as needed. Return to ED should symptoms worsen. Use caution to avoid falls and other trauma. Should you begin to feel dizzy or different than normal, make sure to sit or lie down to avoid injury.

## 2017-01-16 DIAGNOSIS — I10 Essential (primary) hypertension: Secondary | ICD-10-CM | POA: Diagnosis not present

## 2017-01-16 DIAGNOSIS — E1159 Type 2 diabetes mellitus with other circulatory complications: Secondary | ICD-10-CM | POA: Diagnosis not present

## 2017-01-24 DIAGNOSIS — I10 Essential (primary) hypertension: Secondary | ICD-10-CM | POA: Diagnosis not present

## 2017-01-24 DIAGNOSIS — E1121 Type 2 diabetes mellitus with diabetic nephropathy: Secondary | ICD-10-CM | POA: Diagnosis not present

## 2017-01-24 DIAGNOSIS — E7800 Pure hypercholesterolemia, unspecified: Secondary | ICD-10-CM | POA: Diagnosis not present

## 2017-01-24 DIAGNOSIS — E1129 Type 2 diabetes mellitus with other diabetic kidney complication: Secondary | ICD-10-CM | POA: Diagnosis not present

## 2017-01-24 DIAGNOSIS — F332 Major depressive disorder, recurrent severe without psychotic features: Secondary | ICD-10-CM | POA: Diagnosis not present

## 2017-01-24 DIAGNOSIS — Z6837 Body mass index (BMI) 37.0-37.9, adult: Secondary | ICD-10-CM | POA: Diagnosis not present

## 2017-01-24 DIAGNOSIS — N184 Chronic kidney disease, stage 4 (severe): Secondary | ICD-10-CM | POA: Diagnosis not present

## 2017-01-24 DIAGNOSIS — Z1389 Encounter for screening for other disorder: Secondary | ICD-10-CM | POA: Diagnosis not present

## 2017-01-24 DIAGNOSIS — Z0000 Encounter for general adult medical examination without abnormal findings: Secondary | ICD-10-CM | POA: Diagnosis not present

## 2017-01-24 DIAGNOSIS — I638 Other cerebral infarction: Secondary | ICD-10-CM | POA: Diagnosis not present

## 2017-01-24 DIAGNOSIS — E668 Other obesity: Secondary | ICD-10-CM | POA: Diagnosis not present

## 2017-01-24 DIAGNOSIS — E1159 Type 2 diabetes mellitus with other circulatory complications: Secondary | ICD-10-CM | POA: Diagnosis not present

## 2017-02-15 DIAGNOSIS — I10 Essential (primary) hypertension: Secondary | ICD-10-CM | POA: Diagnosis not present

## 2017-02-15 DIAGNOSIS — N184 Chronic kidney disease, stage 4 (severe): Secondary | ICD-10-CM | POA: Diagnosis not present

## 2017-02-15 DIAGNOSIS — Z6836 Body mass index (BMI) 36.0-36.9, adult: Secondary | ICD-10-CM | POA: Diagnosis not present

## 2017-02-15 DIAGNOSIS — E1159 Type 2 diabetes mellitus with other circulatory complications: Secondary | ICD-10-CM | POA: Diagnosis not present

## 2017-04-24 DIAGNOSIS — F332 Major depressive disorder, recurrent severe without psychotic features: Secondary | ICD-10-CM | POA: Diagnosis not present

## 2017-04-24 DIAGNOSIS — I10 Essential (primary) hypertension: Secondary | ICD-10-CM | POA: Diagnosis not present

## 2017-04-24 DIAGNOSIS — E668 Other obesity: Secondary | ICD-10-CM | POA: Diagnosis not present

## 2017-04-24 DIAGNOSIS — E7800 Pure hypercholesterolemia, unspecified: Secondary | ICD-10-CM | POA: Diagnosis not present

## 2017-04-24 DIAGNOSIS — E1129 Type 2 diabetes mellitus with other diabetic kidney complication: Secondary | ICD-10-CM | POA: Diagnosis not present

## 2017-04-24 DIAGNOSIS — N184 Chronic kidney disease, stage 4 (severe): Secondary | ICD-10-CM | POA: Diagnosis not present

## 2017-04-24 DIAGNOSIS — I639 Cerebral infarction, unspecified: Secondary | ICD-10-CM | POA: Diagnosis not present

## 2017-04-24 DIAGNOSIS — E1121 Type 2 diabetes mellitus with diabetic nephropathy: Secondary | ICD-10-CM | POA: Diagnosis not present

## 2017-04-24 DIAGNOSIS — E1159 Type 2 diabetes mellitus with other circulatory complications: Secondary | ICD-10-CM | POA: Diagnosis not present

## 2017-04-24 DIAGNOSIS — Z6837 Body mass index (BMI) 37.0-37.9, adult: Secondary | ICD-10-CM | POA: Diagnosis not present

## 2017-04-24 DIAGNOSIS — R808 Other proteinuria: Secondary | ICD-10-CM | POA: Diagnosis not present

## 2017-06-06 DIAGNOSIS — E1159 Type 2 diabetes mellitus with other circulatory complications: Secondary | ICD-10-CM | POA: Diagnosis not present

## 2017-06-06 DIAGNOSIS — I10 Essential (primary) hypertension: Secondary | ICD-10-CM | POA: Diagnosis not present

## 2017-06-06 DIAGNOSIS — N184 Chronic kidney disease, stage 4 (severe): Secondary | ICD-10-CM | POA: Diagnosis not present

## 2017-06-06 DIAGNOSIS — Z794 Long term (current) use of insulin: Secondary | ICD-10-CM | POA: Diagnosis not present

## 2018-02-26 ENCOUNTER — Encounter (INDEPENDENT_AMBULATORY_CARE_PROVIDER_SITE_OTHER): Payer: Self-pay | Admitting: Ophthalmology

## 2018-03-01 NOTE — Progress Notes (Signed)
[facility] Note  03/02/2018     CHIEF COMPLAINT Patient presents for Retina Evaluation and Diabetic Eye Exam   HISTORY OF PRESENT ILLNESS: Dorothy Harmon is a 67 y.o. female who presents to the clinic today for:   HPI    Retina Evaluation    In both eyes.  This started 5 years ago.  Associated Symptoms Flashes and Floaters.  Negative for Distortion, Redness, Trauma, Shoulder/Hip pain, Fatigue, Weight Loss, Jaw Claudication, Glare, Pain, Blind Spot, Photophobia, Scalp Tenderness and Fever.  Context:  distance vision, mid-range vision and near vision.  Treatments tried include no treatments.  I, the attending physician,  performed the HPI with the patient and updated documentation appropriately.          Diabetic Eye Exam    Vision fluctuates with blood sugars.  Associated Symptoms Negative for Distortion, Redness, Trauma, Shoulder/Hip pain, Fatigue, Weight Loss, Jaw Claudication, Glare, Pain, Floaters, Flashes, Blind Spot, Photophobia, Scalp Tenderness and Fever.  Diabetes characteristics include Type 2 and on insulin.  This started 10 years ago.  Last Blood Glucose 160.  Last A1C 9.  I, the attending physician,  performed the HPI with the patient and updated documentation appropriately.          Comments    Referral of DR. Tisovec for retina/DM eval.Patient states she has had occasional  flashes /floaters when she turns had head for many years. Pt is DM2 x 10 yrs BS160(01/29/18), BS fluctuates,pt reports her insulin has been changed and she has stopped eating candy .Pt is on insulin and metformin .  Pt reports she had CVA 4 yrs ago, and she has decreased kidney function per her nephrologists.  Denies gtt's/vits       Last edited by Bernarda Caffey, MD on 03/02/2018 10:11 AM. (History)    Pt states she has been diabetic x 25 years; Pt states CBG has become much more controlled; Pt states highest CBG has been is in the 300s, and highest A1C was 9;   Referring  physician: Haywood Pao, MD [place], [place] [id number]  HISTORICAL INFORMATION:   Selected notes from the MEDICAL RECORD NUMBER Referred by Dr. Domenick Gong for DM exam LEE:  Ocular Hx- PMH-HTN, high cholesterol, fibromyalgia, DM (A1C: 8.8, taking Toujeo, Soliqua, glimepiride, metformin, irbesartan), arthritis    CURRENT MEDICATIONS: No current outpatient medications on file. (Ophthalmic Drugs)   No current facility-administered medications for this visit.  (Ophthalmic Drugs)   Current Outpatient Medications (Other)  Medication Sig  . clopidogrel (PLAVIX) 75 MG tablet Take 1 tablet (75 mg total) by mouth daily with breakfast.  . irbesartan (AVAPRO) 300 MG tablet Take 300 mg by mouth every morning.   . lovastatin (MEVACOR) 20 MG tablet Take 2 tablets (40 mg total) by mouth at bedtime.  . metFORMIN (GLUCOPHAGE) 500 MG tablet Take 1 tablet (500 mg total) by mouth 2 (two) times daily with a meal.   No current facility-administered medications for this visit.  (Other)      REVIEW OF SYSTEMS: ROS    Positive for: Endocrine, Eyes   Negative for: Constitutional, Gastrointestinal, Neurological, Skin, Genitourinary, Musculoskeletal, HENT, Cardiovascular, Respiratory, Psychiatric, Allergic/Imm, Heme/Lymph   Last edited by Zenovia Jordan, LPN on 08/03/863  7:84 AM. (History)       ALLERGIES Allergies  Allergen Reactions  . Morphine And Related Other (See Comments)    Loss of consciousness, feeling sick, faint  . Penicillins Itching and Swelling  PAST MEDICAL HISTORY Past Medical History:  Diagnosis Date  . Arthritis   . Diabetes mellitus without complication (HCC) ~1324  . Fibromyalgia   . Hypercholesteremia   . Hypertension   . Seizure [facility])    h/o 1 seizure    Past Surgical History:  Procedure Laterality Date  . LAPAROSCOPIC CHOLECYSTECTOMY  2003   Dr. Zella Richer; Chronic calculous cholecystitis and fatty infiltration of the liver  .  RECONSTRUCTION OF NOSE     cosmetic    FAMILY HISTORY Family History  Problem Relation Age of Onset  . Heart attack Father   . Diabetes Mother        alive, 44yo (43)  . Hypertension Mother     SOCIAL HISTORY Social History   Tobacco Use  . Smoking status: Never Smoker  . Smokeless tobacco: Never Used  Substance Use Topics  . Alcohol use: No  . Drug use: No         OPHTHALMIC EXAM:  Base Eye Exam    Visual Acuity (Snellen - Linear)      Right Left   Dist [place] 20/25 20/25   Dist ph [place] 20/20 NI       Tonometry (Tonopen, 9:08 AM)      Right Left   Pressure 14 16       Pupils      Dark Light Shape React APD   Right 3 2 Round Brisk None   Left 3 2 Round Brisk None       Visual Fields (Counting fingers)      Left Right    Full Full       Extraocular Movement      Right Left    Full, Ortho Full, Ortho       Neuro/Psych    Oriented x3:  Yes   Mood/Affect:  Normal       Dilation    Both eyes:  1.0% Mydriacyl, 2.5% Phenylephrine @ 9:08 AM        Slit Lamp and Fundus Exam    Slit Lamp Exam      Right Left   Lids/Lashes Dermatochalasis - upper lid, Meibomian gland dysfunction Dermatochalasis - upper lid, Meibomian gland dysfunction   Conjunctiva/Sclera White and quiet Mild Temporal Pinguecula   Cornea Arcus, 1+ Punctate epithelial erosions Arcus, 1+ Punctate epithelial erosions   Anterior Chamber Deep and quiet Deep and quiet   Iris Round and dilated, No NVI Round and dilated, No NVI   Lens 2+ Nuclear sclerosis, 2+ Cortical cataract 2+ Nuclear sclerosis, 2+ Cortical cataract   Vitreous Vitreous syneresis Vitreous syneresis       Fundus Exam      Right Left   Disc Compact, ? disc crusen Compact, ? disc crusen   C/D Ratio 0.0 0.0   Macula Blunted foveal reflex, few Microaneurysms, no edema Flat, Blunted foveal reflex, scattered Microaneurysms, mild Retinal pigment epithelial mottling   Vessels Vascular attenuation, AV crossing changes Vascular  attenuation   Periphery Attached, few MA Attached, scattered MA        Refraction    Manifest Refraction      Sphere Cylinder Axis Dist VA   Right [place] +0.25 155 20/25-   Left +0.25 Sphere  20/25-2          IMAGING AND PROCEDURES  Imaging and Procedures for @TODAY @  OCT, Retina - OU - Both Eyes       Right Eye Quality was good. Central Foveal Thickness: 269. Progression has  no prior data. Findings include normal foveal contour, no IRF, no SRF (Rare cystic change).   Left Eye Quality was good. Central Foveal Thickness: 282. Progression has no prior data. Findings include normal foveal contour, no IRF, no SRF (Trace cystic change).   Notes *Images captured and stored on drive  Diagnosis / Impression:  NFP, No IRF/SRF OU No frank DME -- +tr cystic changes OU  Clinical management:  See below  Abbreviations: NFP - Normal foveal profile. CME - cystoid macular edema. PED - pigment epithelial detachment. IRF - intraretinal fluid. SRF - subretinal fluid. EZ - ellipsoid zone. ERM - epiretinal membrane. ORA - outer retinal atrophy. ORT - outer retinal tubulation. SRHM - subretinal hyper-reflective material                  ASSESSMENT/PLAN:    ICD-10-CM   1. Mild nonproliferative diabetic retinopathy of both eyes without macular edema associated with type 2 diabetes mellitus ([place]) P38.2505   2. Retinal edema H35.81 OCT, Retina - OU - Both Eyes  3. Essential hypertension I10   4. Hypertensive retinopathy of both eyes H35.033   5. Combined forms of age-related cataract of both eyes H25.813     1. Mild non-proliferative diabetic retinopathy w/o DME, both eyes - The incidence, risk factors for progression, natural history and treatment options for diabetic retinopathy were discussed with patient.   - The need for close monitoring of blood glucose, blood pressure, and serum lipids, avoiding cigarette or any type of tobacco, and the need for long term follow up was also  discussed with patient. - exam shows scatttered MA and DBH OU; no NV - OCT without diabetic macular edema, both eyes  - f/u in 6-9 months  2. No retinal edema on exam or OCT  3, 4. Hypertensive retinopathy OU - discussed importance of tight BP control - monitor  5. Combined form age related cataract OU-  - The symptoms of cataract, surgical options, and treatments and risks were discussed with patient. - discussed diagnosis and progression - not yet visually significant - monitor for now   Ophthalmic Meds Ordered this visit:  No orders of the defined types were placed in this encounter.      Return in about 9 months (around 12/01/2018) for F/U NPDR OU, DFE, OCT.  There are no Patient Instructions on file for this visit.   Explained the diagnoses, plan, and follow up with the patient and they expressed understanding.  Patient expressed understanding of the importance of proper follow up care.   This document serves as a record of services personally performed by Gardiner Sleeper, MD, PhD. It was created on their behalf by Ernest Mallick, OA, an ophthalmic assistant. The creation of this record is the provider's dictation and/or activities during the visit.    Electronically signed by: Ernest Mallick, OA  09.12.2019 1:32 AM   This document serves as a record of services personally performed by Gardiner Sleeper, MD, PhD. It was created on their behalf by Catha Brow, [place], a certified ophthalmic assistant. The creation of this record is the provider's dictation and/or activities during the visit.  Electronically signed by: Catha Brow, COA  09.13.19 1:32 AM   Gardiner Sleeper, M.D., Ph.D. Diseases & Surgery of the Retina and Vitreous [facility]  I have reviewed the above documentation for accuracy and completeness, and I agree with the above. Gardiner Sleeper, M.D., Ph.D. 03/03/18 1:34 AM  Abbreviations: M myopia (nearsighted); A astigmatism; H  hyperopia (farsighted); P presbyopia; Mrx spectacle prescription;  CTL contact lenses; OD right eye; OS left eye; OU both eyes  XT exotropia; ET esotropia; PEK punctate epithelial keratitis; PEE punctate epithelial erosions; DES dry eye syndrome; MGD meibomian gland dysfunction; ATs artificial tears; PFAT's preservative free artificial tears; [place] nuclear sclerotic cataract; PSC posterior subcapsular cataract; ERM epi-retinal membrane; PVD posterior vitreous detachment; RD retinal detachment; DM diabetes mellitus; DR diabetic retinopathy; NPDR non-proliferative diabetic retinopathy; PDR proliferative diabetic retinopathy; CSME clinically significant macular edema; DME diabetic macular edema; dbh dot blot hemorrhages; CWS cotton wool spot; POAG primary open angle glaucoma; C/D cup-to-disc ratio; HVF humphrey visual field; GVF goldmann visual field; OCT optical coherence tomography; IOP intraocular pressure; BRVO Branch retinal vein occlusion; CRVO central retinal vein occlusion; CRAO central retinal artery occlusion; BRAO branch retinal artery occlusion; RT retinal tear; SB scleral buckle; PPV pars plana vitrectomy; VH Vitreous hemorrhage; PRP panretinal laser photocoagulation; IVK intravitreal kenalog; VMT vitreomacular traction; MH Macular hole;  NVD neovascularization of the disc; NVE neovascularization elsewhere; AREDS age related eye disease study; ARMD age related macular degeneration; POAG primary open angle glaucoma; EBMD epithelial/anterior basement membrane dystrophy; ACIOL anterior chamber intraocular lens; IOL intraocular lens; PCIOL posterior chamber intraocular lens; Phaco/IOL phacoemulsification with intraocular lens placement; [place] photorefractive keratectomy; LASIK laser assisted in situ keratomileusis; HTN hypertension; DM diabetes mellitus; COPD chronic obstructive pulmonary disease

## 2018-03-02 ENCOUNTER — Ambulatory Visit (INDEPENDENT_AMBULATORY_CARE_PROVIDER_SITE_OTHER): Payer: Medicare Other | Admitting: Ophthalmology

## 2018-03-02 ENCOUNTER — Encounter (INDEPENDENT_AMBULATORY_CARE_PROVIDER_SITE_OTHER): Payer: Self-pay | Admitting: Ophthalmology

## 2018-03-02 ENCOUNTER — Other Ambulatory Visit: Payer: Self-pay | Admitting: Internal Medicine

## 2018-03-02 DIAGNOSIS — I10 Essential (primary) hypertension: Secondary | ICD-10-CM | POA: Diagnosis not present

## 2018-03-02 DIAGNOSIS — H35033 Hypertensive retinopathy, bilateral: Secondary | ICD-10-CM | POA: Diagnosis not present

## 2018-03-02 DIAGNOSIS — H3581 Retinal edema: Secondary | ICD-10-CM | POA: Diagnosis not present

## 2018-03-02 DIAGNOSIS — E113293 Type 2 diabetes mellitus with mild nonproliferative diabetic retinopathy without macular edema, bilateral: Secondary | ICD-10-CM | POA: Diagnosis not present

## 2018-03-02 DIAGNOSIS — N183 Chronic kidney disease, stage 3 unspecified: Secondary | ICD-10-CM

## 2018-03-02 DIAGNOSIS — H25813 Combined forms of age-related cataract, bilateral: Secondary | ICD-10-CM

## 2018-03-03 ENCOUNTER — Encounter (INDEPENDENT_AMBULATORY_CARE_PROVIDER_SITE_OTHER): Payer: Self-pay | Admitting: Ophthalmology

## 2018-03-05 ENCOUNTER — Ambulatory Visit
Admission: RE | Admit: 2018-03-05 | Discharge: 2018-03-05 | Disposition: A | Discharge status: Home / Self Care | Payer: Medicare Other | Source: Ambulatory Visit | Attending: Internal Medicine | Admitting: Internal Medicine

## 2018-03-05 DIAGNOSIS — N183 Chronic kidney disease, stage 3 unspecified: Secondary | ICD-10-CM

## 2019-01-23 ENCOUNTER — Encounter: Payer: Self-pay | Admitting: Cardiology

## 2019-01-24 ENCOUNTER — Ambulatory Visit: Payer: Self-pay | Admitting: Cardiology

## 2019-02-13 DIAGNOSIS — E668 Other obesity: Secondary | ICD-10-CM | POA: Insufficient documentation

## 2019-02-13 DIAGNOSIS — R079 Chest pain, unspecified: Secondary | ICD-10-CM | POA: Insufficient documentation

## 2019-02-13 NOTE — Progress Notes (Signed)
Patient referred by Haywood Pao, MD for chest pain  Subjective:   Dorothy Harmon, female    DOB: 1951-01-02, 68 y.o.   MRN: [id number]   Chief Complaint  Patient presents with  . New Patient (Initial Visit)  . Chronic Kidney Disease    lV  . Hypertension  . Chest Pain  . Diabetes    DM ll    HPI  68 y.o. 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  Episode of chest pain occurred few weeks ago.  Pain was sharp, retrosternal, lasted for 5 to 6 seconds while at rest, and resolved on its own.  Patient does not do any regular physical activity, but she does do all household chores.  She denies any chest pain or shortness of breath on physical exertion.  Patient had left thalamic stroke in 2014.  Details below.  She does not have any gross focal neurological deficits. She has not had any recurrent stroke since then, but she does wonder if she has had TIA episodes.  She describes them as sudden onset generalized weakness, without any focal deficits.    Her blood pressure is elevated today.  Patient states that blood pressure is usually lower at home.  She is currently on irbesartan 300 mg daily.  She is on lovastatin for her hyperlipidemia.  Diabetes is uncontrolled with A1c of 8.8%.  She is working closely with her PCP regarding the same.  She is seeing a nephrologist given her CKD stage IV.  Possibility of dialysis at some point has been mentioned by nephrologist to the patient.  Past Medical History:  Diagnosis Date  . Arthritis   . Diabetes mellitus without complication (HCC) Q000111Q  . Fibromyalgia   . Hypercholesteremia   . Hypertension   . Seizure [facility])    h/o 1 seizure      Past Surgical History:  Procedure Laterality Date  . LAPAROSCOPIC CHOLECYSTECTOMY  2003   Dr. Zella Richer; Chronic calculous cholecystitis and  fatty infiltration of the liver  . RECONSTRUCTION OF NOSE     cosmetic     Social History   Socioeconomic History  . Marital status: Divorced    Spouse name: Not on file  . Number of children: 2  . Years of education: 41  . Highest education level: Not on file  Occupational History  . Occupation: customer service    Comment: [employer]  Social Needs  . Financial resource strain: Not on file  . Food insecurity    Worry: Not on file    Inability: Not on file  . Transportation needs    Medical: Not on file    Non-medical: Not on file  Tobacco Use  . Smoking status: Never Smoker  . Smokeless tobacco: Never Used  Substance and Sexual Activity  . Alcohol use: No  . Drug use: No  . Sexual activity: Not on file  Lifestyle  . Physical activity    Days per week: Not on file    Minutes per session: Not on file  . Stress: Not on file  Relationships  . Social Herbalist on phone: Not on file    Gets together: Not on file    Attends religious service: Not on file    Active member of club or organization: Not on file    Attends meetings of clubs or organizations:  Not on file    Relationship status: Not on file  . Intimate partner violence    Fear of current or ex partner: Not on file    Emotionally abused: Not on file    Physically abused: Not on file    Forced sexual activity: Not on file  Other Topics Concern  . Not on file  Social History Narrative   Mother lives with her; divorced   Retired    Education   Right handed.   Caffeine- None      Family History  Problem Relation Age of Onset  . Heart attack Father   . Diabetes Mother        alive, 37yo (42)  . Hypertension Mother      Current Outpatient Medications on File Prior to Visit  Medication Sig Dispense Refill  . clopidogrel (PLAVIX) 75 MG tablet Take 1 tablet (75 mg total) by mouth daily with breakfast. 30 tablet 2  . Insulin Glargine, 1 Unit Dial, 300 UNIT/ML SOPN Inject 40 Units into  the skin daily.    . irbesartan (AVAPRO) 300 MG tablet Take 300 mg by mouth every morning.     . lovastatin (MEVACOR) 20 MG tablet Take 2 tablets (40 mg total) by mouth at bedtime. 60 tablet 2  . metFORMIN (GLUCOPHAGE) 500 MG tablet Take 1 tablet (500 mg total) by mouth 2 (two) times daily with a meal. 20 tablet 0   No current facility-administered medications on file prior to visit.     Cardiovascular studies:  EKG 08/27/20290: Sinus rhythm 96 bpm. Normal EKG.   Vascular US 04/22/2013: Bilateral - 1% to 39% ICA stenosis lowest end of scale.  Vertebral artery flow is antegrade.   Echocardiogram 04/22/2013: - Left ventricle: The cavity size was normal. Systolic  function was normal. The estimated ejection fraction was  in the range of 60% to 65%. Wall motion was normal; there  were no regional wall motion abnormalities.  - Aortic valve: Trivial regurgitation.  - Mitral valve: Calcified annulus.  - Atrial septum: There was an atrial septal aneurysm.   MRI Brain 2014: 1.2 cm acute infarction within the left thalamus. No hemorrhage or swelling. Old small vessel infarctions in the right cerebellum. Mild chronic small-vessel change of the cerebral hemispheric white matter. No major vessel occlusion. Posterior circulation branch vessels are diminutive and there is some atherosclerotic narrowing of the proximal right posterior cerebral artery.  Recent labs: 08/09/2018: Glucose 277. BUN/Cr 21/ 1.7. Na/K 141/4.2.  Hb 14.7 g/dl HbA1C 8.8%   Review of Systems  Constitution: Negative for decreased appetite, malaise/fatigue, weight gain and weight loss.  HENT: Negative for congestion.   Eyes: Negative for visual disturbance.  Cardiovascular: Positive for chest pain (1 episode, as described in HPI). Negative for dyspnea on exertion, leg swelling, palpitations and syncope.  Respiratory: Negative for cough.   Endocrine: Negative for cold intolerance.  Hematologic/Lymphatic: Does  not bruise/bleed easily.  Skin: Negative for itching and rash.  Musculoskeletal: Negative for myalgias.  Gastrointestinal: Negative for abdominal pain, nausea and vomiting.  Genitourinary: Negative for dysuria.  Neurological: Negative for dizziness and weakness.  Psychiatric/Behavioral: The patient is not nervous/anxious.   All other systems reviewed and are negative.        Vitals:   02/14/19 1122 02/14/19 1123  BP: (!) 184/105 (!) 193/110  Pulse: (!) 102 (!) 102  Temp:    SpO2: 97% 98%     Body mass index is 36.3 kg/m. Filed Weights  02/14/19 1048  Weight: 211 lb 8 oz (95.9 kg)     Objective:   Physical Exam  Constitutional: She is oriented to person, place, and time. She appears well-developed and well-nourished. No distress.  HENT:  Head: Normocephalic and atraumatic.  Eyes: Pupils are equal, round, and reactive to light. Conjunctivae are normal.  Neck: No JVD present.  Cardiovascular: Normal rate, regular rhythm and intact distal pulses.  No murmur heard. Pulses:      Carotid pulses are on the right side with bruit. Pulmonary/Chest: Effort normal and breath sounds normal. She has no wheezes. She has no rales.  Abdominal: Soft. Bowel sounds are normal. There is no rebound.  Musculoskeletal:        General: No edema.  Lymphadenopathy:    She has no cervical adenopathy.  Neurological: She is alert and oriented to person, place, and time. No cranial nerve deficit.  Skin: Skin is warm and dry.  Psychiatric: She has a normal mood and affect.  Nursing note and vitals reviewed.         Assessment & Recommendations:   68 y.o. 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  Chest pain: One episode lasting for 6 seconds.  No recurrence with walking at home. Patient has risk factors for coronary artery disease, her  episode of chest pain was likely nonanginal. Given patient's advanced CKD, management would likely be medical at this time unless significant angina episodes occur.  For this reason, do not recommend stress test at this time.  However, I will follow patient clinically for any anginal symptoms.   Recommend addition of amlodipine 5 mg daily as antihypertensive as well as antianginal agent.    Hypertension:  Uncontrolled.  Added amlodipine 5 mg daily.  Also on irbesartan 300 mg daily.  History of stroke: Right-sided carotid bruit.  Will obtain echocardiogram and carotid duplex ultrasound.  Uncontrolled type 2 diabetes mellitus: Remains a very high risk factor for further cardiovascular disease.  Had a long discussion with the patient regarding diet and lifestyle changes, specifically reducing calorie intake and regular walking.  Continue follow-up regarding diabetes management with Dr. Loren Racer office.   Thank you for referring the patient to [place]. Please feel free to contact with any questions.  Nigel Mormon, MD [facility] Pager: [phone] Office: [phone] If no answer Cell [phone]

## 2019-02-14 ENCOUNTER — Ambulatory Visit (INDEPENDENT_AMBULATORY_CARE_PROVIDER_SITE_OTHER): Payer: Medicare Other | Admitting: Cardiology

## 2019-02-14 ENCOUNTER — Encounter: Payer: Self-pay | Admitting: Cardiology

## 2019-02-14 ENCOUNTER — Other Ambulatory Visit: Payer: Self-pay

## 2019-02-14 VITALS — BP 193/110 | HR 102 | Temp 98.2°F | Ht 64.0 in | Wt 211.5 lb

## 2019-02-14 DIAGNOSIS — E1165 Type 2 diabetes mellitus with hyperglycemia: Secondary | ICD-10-CM

## 2019-02-14 DIAGNOSIS — E669 Obesity, unspecified: Secondary | ICD-10-CM

## 2019-02-14 DIAGNOSIS — E118 Type 2 diabetes mellitus with unspecified complications: Secondary | ICD-10-CM | POA: Diagnosis not present

## 2019-02-14 DIAGNOSIS — E668 Other obesity: Secondary | ICD-10-CM | POA: Diagnosis not present

## 2019-02-14 DIAGNOSIS — R079 Chest pain, unspecified: Secondary | ICD-10-CM | POA: Diagnosis not present

## 2019-02-14 DIAGNOSIS — Z8673 Personal history of transient ischemic attack (TIA), and cerebral infarction without residual deficits: Secondary | ICD-10-CM

## 2019-02-14 DIAGNOSIS — I10 Essential (primary) hypertension: Secondary | ICD-10-CM

## 2019-02-14 DIAGNOSIS — Z794 Long term (current) use of insulin: Secondary | ICD-10-CM

## 2019-02-14 DIAGNOSIS — IMO0002 Reserved for concepts with insufficient information to code with codable children: Secondary | ICD-10-CM

## 2019-02-14 MED ORDER — NITROGLYCERIN 0.4 MG SL SUBL: 0.4000 mg | SUBLINGUAL_TABLET | SUBLINGUAL | 1 refills | Status: DC | PRN

## 2019-02-14 MED ORDER — ROSUVASTATIN CALCIUM 20 MG PO TABS: 20.0000 mg | ORAL_TABLET | Freq: Every day | ORAL | 3 refills | Status: DC

## 2019-02-14 MED ORDER — AMLODIPINE BESYLATE 5 MG PO TABS: 5.0000 mg | ORAL_TABLET | Freq: Every day | ORAL | 3 refills | Status: DC

## 2019-03-04 ENCOUNTER — Other Ambulatory Visit: Payer: Self-pay | Admitting: Cardiology

## 2019-03-04 DIAGNOSIS — I10 Essential (primary) hypertension: Secondary | ICD-10-CM

## 2019-03-04 DIAGNOSIS — Z8673 Personal history of transient ischemic attack (TIA), and cerebral infarction without residual deficits: Secondary | ICD-10-CM

## 2019-03-05 ENCOUNTER — Other Ambulatory Visit: Payer: Self-pay

## 2019-03-05 ENCOUNTER — Ambulatory Visit (INDEPENDENT_AMBULATORY_CARE_PROVIDER_SITE_OTHER): Payer: Medicare Other

## 2019-03-05 DIAGNOSIS — Z8673 Personal history of transient ischemic attack (TIA), and cerebral infarction without residual deficits: Secondary | ICD-10-CM

## 2019-03-05 DIAGNOSIS — I10 Essential (primary) hypertension: Secondary | ICD-10-CM

## 2019-03-10 NOTE — Progress Notes (Signed)
Patient referred by Haywood Pao, MD for chest pain  Subjective:   Dorothy Harmon, female    DOB: 1951/01/28, 68 y.o.   MRN: [id number]   Chief Complaint  Patient presents with  . Hypertension    chest pain  . Cerebrovascular Accident  . Results    echo  . Follow-up    HPI  68 y.o. 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 Details below.  Blood pressure remains uncontrolled. She denies chest pain, shortness of breath, palpitations, leg edema, orthopnea, PND, TIA/syncope.   Past Medical History:  Diagnosis Date  . Arthritis   . Diabetes mellitus without complication (HCC) Q000111Q  . Fibromyalgia   . Hypercholesteremia   . Hypertension   . Seizure [facility])    h/o 1 seizure      Past Surgical History:  Procedure Laterality Date  . LAPAROSCOPIC CHOLECYSTECTOMY  2003   Dr. Zella Richer; Chronic calculous cholecystitis and fatty infiltration of the liver  . RECONSTRUCTION OF NOSE     cosmetic     Social History   Socioeconomic History  . Marital status: Divorced    Spouse name: Not on file  . Number of children: 2  . Years of education: 87  . Highest education level: Not on file  Occupational History  . Occupation: customer service    Comment: [employer]  Social Needs  . Financial resource strain: Not on file  . Food insecurity    Worry: Not on file    Inability: Not on file  . Transportation needs    Medical: Not on file    Non-medical: Not on file  Tobacco Use  . Smoking status: Never Smoker  . Smokeless tobacco: Never Used  Substance and Sexual Activity  . Alcohol use: No  . Drug use: No  . Sexual activity: Not on file  Lifestyle  . Physical activity    Days per week: Not on file    Minutes per session: Not on file  . Stress: Not on file  Relationships  .  Social Herbalist on phone: Not on file    Gets together: Not on file    Attends religious service: Not on file    Active member of club or organization: Not on file    Attends meetings of clubs or organizations: Not on file    Relationship status: Not on file  . Intimate partner violence    Fear of current or ex partner: Not on file    Emotionally abused: Not on file    Physically abused: Not on file    Forced sexual activity: Not on file  Other Topics Concern  . Not on file  Social History Narrative   Mother lives with her; divorced   Retired    Education   Right handed.   Caffeine- None      Family History  Problem Relation Age of Onset  . Heart attack Father   . Diabetes Mother        alive, 15yo (29)  . Hypertension Mother      Current Outpatient Medications on File Prior to Visit  Medication Sig Dispense Refill  . amLODipine (NORVASC) 5 MG tablet Take 1 tablet (5 mg total) by mouth daily. 30 tablet 3  . clopidogrel (PLAVIX) 75 MG tablet Take 1 tablet (75 mg total)  by mouth daily with breakfast. 30 tablet 2  . Insulin Glargine, 1 Unit Dial, 300 UNIT/ML SOPN Inject 41 Units into the skin daily.     . irbesartan (AVAPRO) 300 MG tablet Take 300 mg by mouth every morning.     . metFORMIN (GLUCOPHAGE) 500 MG tablet Take 1 tablet (500 mg total) by mouth 2 (two) times daily with a meal. 20 tablet 0  . nitroGLYCERIN (NITROSTAT) 0.4 MG SL tablet Place 1 tablet (0.4 mg total) under the tongue every 5 (five) minutes as needed for chest pain. 30 tablet 1  . rosuvastatin (CRESTOR) 20 MG tablet Take 1 tablet (20 mg total) by mouth daily. 30 tablet 3   No current facility-administered medications on file prior to visit.     Cardiovascular studies:  Echocardiogram 03/05/2019: Left ventricle cavity is normal in size. Moderate concentric hypertrophy of the left ventricle. Normal LV systolic function with EF 55%. Normal global wall motion. Doppler evidence of grade I  (impaired) diastolic dysfunction, normal LAP. Left atrial cavity is mildly dilated. 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 Trileaflet aortic valve. Trace aortic regurgitation. Mild tricuspid regurgitation.  No evidence of pulmonary hypertension.  Carotid artery duplex  03/05/2019: No hemodynamically significant arterial disease in the internal carotid artery bilaterally. Minimal homogeneous plaque noted bilaterally.  Antegrade right vertebral artery flow. Antegrade left vertebral artery flow.  EKG 08/27/20290: Sinus rhythm 96 bpm. Normal EKG.   Vascular US 04/22/2013: Bilateral - 1% to 39% ICA stenosis lowest end of scale.  Vertebral artery flow is antegrade.   Echocardiogram 04/22/2013: - Left ventricle: The cavity size was normal. Systolic  function was normal. The estimated ejection fraction was  in the range of 60% to 65%. Wall motion was normal; there  were no regional wall motion abnormalities.  - Aortic valve: Trivial regurgitation.  - Mitral valve: Calcified annulus.  - Atrial septum: There was an atrial septal aneurysm.   MRI Brain 2014: 1.2 cm acute infarction within the left thalamus. No hemorrhage or swelling. Old small vessel infarctions in the right cerebellum. Mild chronic small-vessel change of the cerebral hemispheric white matter. No major vessel occlusion. Posterior circulation branch vessels are diminutive and there is some atherosclerotic narrowing of the proximal right posterior cerebral artery.  Recent labs: 08/09/2018: Glucose 277. BUN/Cr 21/ 1.7. Na/K 141/4.2.  Hb 14.7 g/dl HbA1C 8.8%   Review of Systems  Constitution: Negative for decreased appetite, malaise/fatigue, weight gain and weight loss.  HENT: Negative for congestion.   Eyes: Negative for visual disturbance.  Cardiovascular: Positive for chest pain (1 episode, as described in HPI). Negative for dyspnea on exertion, leg swelling,  palpitations and syncope.  Respiratory: Negative for cough.   Endocrine: Negative for cold intolerance.  Hematologic/Lymphatic: Does not bruise/bleed easily.  Skin: Negative for itching and rash.  Musculoskeletal: Negative for myalgias.  Gastrointestinal: Negative for abdominal pain, nausea and vomiting.  Genitourinary: Negative for dysuria.  Neurological: Negative for dizziness and weakness.  Psychiatric/Behavioral: The patient is not nervous/anxious.   All other systems reviewed and are negative.        Vitals:   03/18/19 1037 03/18/19 1042  BP: (!) 183/97 (!) 170/82  Pulse: 87 84  Temp: 97.8 F (36.6 C)   SpO2: 98%      Body mass index is 34.67 kg/m. Filed Weights   03/18/19 1037  Weight: 202 lb (91.6 kg)     Objective:   Physical Exam  Constitutional: She is oriented  to person, place, and time. She appears well-developed and well-nourished. No distress.  HENT:  Head: Normocephalic and atraumatic.  Eyes: Pupils are equal, round, and reactive to light. Conjunctivae are normal.  Neck: No JVD present.  Cardiovascular: Normal rate, regular rhythm and intact distal pulses.  No murmur heard. Pulses:      Carotid pulses are on the right side with bruit. Pulmonary/Chest: Effort normal and breath sounds normal. She has no wheezes. She has no rales.  Abdominal: Soft. Bowel sounds are normal. There is no rebound.  Musculoskeletal:        General: No edema.  Lymphadenopathy:    She has no cervical adenopathy.  Neurological: She is alert and oriented to person, place, and time. No cranial nerve deficit.  Skin: Skin is warm and dry.  Psychiatric: She has a normal mood and affect.  Nursing note and vitals reviewed.         Assessment & Recommendations:   68 y.o. 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  No recurrence.    Hypertension:  Uncontrolled.  Increase amlodipine to 10 mg daily.  Also on irbesartan 300 mg daily. Recommend close f/u w/PCP for BP management. She has a visit scheduled next month. Consider adding carvedilol at that time.   History of stroke: No significant carotid artery disease. Continue risk factor modification. Continue plavix 75 mg, Crestor 20 mg.   Uncontrolled type 2 diabetes mellitus: Remains a very high risk factor for further cardiovascular disease.  Had a long discussion with the patient regarding diet and lifestyle changes, specifically reducing calorie intake and regular walking.  Continue follow-up regarding diabetes management with Dr. Loren Racer office.  I will see her on as needed basis.     Nigel Mormon, MD [facility] Pager: [phone] Office: [phone] If no answer Cell [phone]

## 2019-03-17 DIAGNOSIS — IMO0002 Reserved for concepts with insufficient information to code with codable children: Secondary | ICD-10-CM | POA: Insufficient documentation

## 2019-03-17 DIAGNOSIS — E1165 Type 2 diabetes mellitus with hyperglycemia: Secondary | ICD-10-CM | POA: Insufficient documentation

## 2019-03-17 DIAGNOSIS — E118 Type 2 diabetes mellitus with unspecified complications: Secondary | ICD-10-CM | POA: Insufficient documentation

## 2019-03-18 ENCOUNTER — Ambulatory Visit (INDEPENDENT_AMBULATORY_CARE_PROVIDER_SITE_OTHER): Payer: Medicare Other | Admitting: Cardiology

## 2019-03-18 ENCOUNTER — Encounter: Payer: Self-pay | Admitting: Cardiology

## 2019-03-18 ENCOUNTER — Other Ambulatory Visit: Payer: Self-pay

## 2019-03-18 VITALS — BP 172/87 | HR 83 | Temp 97.8°F | Ht 64.0 in | Wt 202.0 lb

## 2019-03-18 DIAGNOSIS — I10 Essential (primary) hypertension: Secondary | ICD-10-CM | POA: Diagnosis not present

## 2019-03-18 DIAGNOSIS — IMO0002 Reserved for concepts with insufficient information to code with codable children: Secondary | ICD-10-CM

## 2019-03-18 DIAGNOSIS — E669 Obesity, unspecified: Secondary | ICD-10-CM

## 2019-03-18 DIAGNOSIS — E668 Other obesity: Secondary | ICD-10-CM | POA: Diagnosis not present

## 2019-03-18 DIAGNOSIS — Z794 Long term (current) use of insulin: Secondary | ICD-10-CM

## 2019-03-18 DIAGNOSIS — E1165 Type 2 diabetes mellitus with hyperglycemia: Secondary | ICD-10-CM

## 2019-03-18 DIAGNOSIS — E118 Type 2 diabetes mellitus with unspecified complications: Secondary | ICD-10-CM | POA: Diagnosis not present

## 2019-03-18 DIAGNOSIS — Z8673 Personal history of transient ischemic attack (TIA), and cerebral infarction without residual deficits: Secondary | ICD-10-CM | POA: Diagnosis not present

## 2019-03-18 MED ORDER — AMLODIPINE BESYLATE 10 MG PO TABS: 10.0000 mg | ORAL_TABLET | Freq: Every day | ORAL | 3 refills | Status: DC

## 2019-05-10 ENCOUNTER — Other Ambulatory Visit: Payer: Self-pay | Admitting: Cardiology

## 2019-05-10 DIAGNOSIS — I10 Essential (primary) hypertension: Secondary | ICD-10-CM

## 2019-05-10 DIAGNOSIS — Z8673 Personal history of transient ischemic attack (TIA), and cerebral infarction without residual deficits: Secondary | ICD-10-CM

## 2019-08-06 ENCOUNTER — Other Ambulatory Visit: Payer: Self-pay | Admitting: Internal Medicine

## 2019-08-06 DIAGNOSIS — Z1231 Encounter for screening mammogram for malignant neoplasm of breast: Secondary | ICD-10-CM

## 2019-08-15 ENCOUNTER — Emergency Department (HOSPITAL_COMMUNITY): Payer: Medicare Other

## 2019-08-15 ENCOUNTER — Other Ambulatory Visit: Payer: Self-pay

## 2019-08-15 ENCOUNTER — Encounter (HOSPITAL_COMMUNITY): Payer: Self-pay | Admitting: Emergency Medicine

## 2019-08-15 ENCOUNTER — Emergency Department (HOSPITAL_COMMUNITY)
Admission: EM | Admit: 2019-08-15 | Discharge: 2019-08-15 | Disposition: A | Discharge status: Home / Self Care | Payer: Medicare Other | Attending: Emergency Medicine | Admitting: Emergency Medicine

## 2019-08-15 DIAGNOSIS — E119 Type 2 diabetes mellitus without complications: Secondary | ICD-10-CM | POA: Insufficient documentation

## 2019-08-15 DIAGNOSIS — R072 Precordial pain: Secondary | ICD-10-CM | POA: Insufficient documentation

## 2019-08-15 DIAGNOSIS — Z7901 Long term (current) use of anticoagulants: Secondary | ICD-10-CM | POA: Diagnosis not present

## 2019-08-15 DIAGNOSIS — Z7984 Long term (current) use of oral hypoglycemic drugs: Secondary | ICD-10-CM | POA: Insufficient documentation

## 2019-08-15 DIAGNOSIS — I10 Essential (primary) hypertension: Secondary | ICD-10-CM | POA: Insufficient documentation

## 2019-08-15 LAB — URINALYSIS, ROUTINE W REFLEX MICROSCOPIC
Bilirubin Urine: NEGATIVE
Glucose, UA: NEGATIVE mg/dL
Ketones, ur: NEGATIVE mg/dL
Nitrite: NEGATIVE
Protein, ur: 100 mg/dL — AB
Specific Gravity, Urine: 1.018 (ref 1.005–1.030)
pH: 5 (ref 5.0–8.0)

## 2019-08-15 LAB — CBC WITH DIFFERENTIAL/PLATELET
Abs Immature Granulocytes: 0.01 10*3/uL (ref 0.00–0.07)
Basophils Absolute: 0 10*3/uL (ref 0.0–0.1)
Basophils Relative: 1 %
Eosinophils Absolute: 0.1 10*3/uL (ref 0.0–0.5)
Eosinophils Relative: 2 %
HCT: 41.7 % (ref 36.0–46.0)
Hemoglobin: 13.7 g/dL (ref 12.0–15.0)
Immature Granulocytes: 0 %
Lymphocytes Relative: 28 %
Lymphs Abs: 1.8 10*3/uL (ref 0.7–4.0)
MCH: 30.5 pg (ref 26.0–34.0)
MCHC: 32.9 g/dL (ref 30.0–36.0)
MCV: 92.9 fL (ref 80.0–100.0)
Monocytes Absolute: 0.4 10*3/uL (ref 0.1–1.0)
Monocytes Relative: 6 %
Neutro Abs: 4.1 10*3/uL (ref 1.7–7.7)
Neutrophils Relative %: 63 %
Platelets: 263 10*3/uL (ref 150–400)
RBC: 4.49 MIL/uL (ref 3.87–5.11)
RDW: 13 % (ref 11.5–15.5)
WBC: 6.4 10*3/uL (ref 4.0–10.5)
nRBC: 0 % (ref 0.0–0.2)

## 2019-08-15 LAB — BASIC METABOLIC PANEL
Anion gap: 11 (ref 5–15)
BUN: 31 mg/dL — ABNORMAL HIGH (ref 8–23)
CO2: 21 mmol/L — ABNORMAL LOW (ref 22–32)
Calcium: 8.9 mg/dL (ref 8.9–10.3)
Chloride: 110 mmol/L (ref 98–111)
Creatinine, Ser: 1.93 mg/dL — ABNORMAL HIGH (ref 0.44–1.00)
GFR calc Af Amer: 30 mL/min — ABNORMAL LOW (ref >60)
GFR calc non Af Amer: 26 mL/min — ABNORMAL LOW (ref >60)
Glucose, Bld: 122 mg/dL — ABNORMAL HIGH (ref 70–99)
Potassium: 4 mmol/L (ref 3.5–5.1)
Sodium: 142 mmol/L (ref 135–145)

## 2019-08-15 LAB — TROPONIN I (HIGH SENSITIVITY): Troponin I (High Sensitivity): 14 ng/L (ref <18)

## 2019-08-15 MED ORDER — SODIUM CHLORIDE 0.9 % IV BOLUS
1000.0000 mL | Freq: Once | INTRAVENOUS | Status: AC
  Administered 2019-08-15: 1000 mL via INTRAVENOUS

## 2019-08-15 NOTE — ED Provider Notes (Signed)
[facility] EMERGENCY DEPARTMENT Provider Note   CSN: [id number] Arrival date & time: 08/15/19  [id number]   History Chest pain  Dorothy Harmon is a 69 y.o. female with past medical history sniffing for hypertension, hypercholesterolemia, diabetes who presents for evaluation of chest pain.  Patient states she has had generalized chest pain and burping which started yesterday around lunchtime.  Pain has gradually improved through today.  Originally had some mild neck pain however states patient did not radiate to her midline back, left shoulder or left jaw.  No exertional or pleuritic chest pain.  No associated diaphoresis, lightheadedness, dizziness.  Pain currently a 3/10 described as a burning sensation.  Has had some indigestion in the past which felt "kind a similar but not really."  Denies fever, chills, nausea, vomiting, shortness of breath hemoptysis, cough, pain, diarrhea, dysuria, lateral leg swelling, redness, warmth.  No prior history of PE, DVT.  No PND orthopnea.  History obtained from patient and past medical records.  No interpreter used.   Followed by Dr. Virgina Jock with Cardiology. Apparently has 1 episode of CP earlier this year. Card thought non anginal in nature.  Patient is not generally active however does not get any exertional chest pain with her daily activities or ADLs.  HPI     Past Medical History:  Diagnosis Date  . Arthritis   . Diabetes mellitus without complication (HCC) [id number]  . Fibromyalgia   . Hypercholesteremia   . Hypertension   . Seizure [facility])    h/o 1 seizure     Patient Active Problem List   Diagnosis Date Noted  . Uncontrolled diabetes mellitus with complication, with long-term current use of insulin ([place]) 03/17/2019  . Chest pain 02/13/2019  . Moderate obesity 02/13/2019  . Dyslipidemia 04/22/2013  . Stroke ([place]) 04/21/2013  . Uncontrolled diabetes mellitus without complication [id number]  . Essential hypertension 04/21/2013   . H/O: stroke 04/21/2013    Past Surgical History:  Procedure Laterality Date  . LAPAROSCOPIC CHOLECYSTECTOMY  2003   Dr. Zella Richer; Chronic calculous cholecystitis and fatty infiltration of the liver  . RECONSTRUCTION OF NOSE     cosmetic     OB History   No obstetric history on file.     Family History  Problem Relation Age of Onset  . Heart attack Father   . Diabetes Mother        alive, 81yo (74)  . Hypertension Mother     Social History   Tobacco Use  . Smoking status: Never Smoker  . Smokeless tobacco: Never Used  Substance Use Topics  . Alcohol use: No  . Drug use: No    Home Medications Prior to Admission medications   Medication Sig Start Date End Date Taking? Authorizing Provider  amLODipine (NORVASC) 5 MG tablet TAKE 1 TABLET BY MOUTH EVERY DAY Patient taking differently: Take 5 mg by mouth daily.  05/10/19  Yes Patwardhan, Reynold Bowen, MD  clopidogrel (PLAVIX) 75 MG tablet Take 1 tablet (75 mg total) by mouth daily with breakfast. 04/24/13  Yes Effie Berkshire, MD  Insulin Glargine, 1 Unit Dial, 300 UNIT/ML SOPN Inject 41 Units into the skin daily.    Yes [provider]  irbesartan (AVAPRO) 300 MG tablet Take 300 mg by mouth every morning.    Yes [provider]  metFORMIN (GLUCOPHAGE) 500 MG tablet Take 1 tablet (500 mg total) by mouth 2 (two) times daily with a meal. 04/24/13  Yes Effie Berkshire, MD  rosuvastatin (CRESTOR) 20 MG tablet TAKE 1 TABLET BY MOUTH EVERY DAY Patient taking differently: Take 20 mg by mouth daily.  05/10/19  Yes Patwardhan, Manish J, MD  nitroGLYCERIN (NITROSTAT) 0.4 MG SL tablet Place 1 tablet (0.4 mg total) under the tongue every 5 (five) minutes as needed for chest pain. 02/14/19 05/15/19  Nigel Mormon, MD    Allergies    Morphine and related and Penicillins  Review of Systems   Review of Systems  Constitutional: Negative.   HENT: Negative.   Respiratory: Negative.   Cardiovascular: Positive  for chest pain. Negative for palpitations and leg swelling.  Gastrointestinal: Negative.   Genitourinary: Negative.   Musculoskeletal: Negative.   Skin: Negative.   Neurological: Negative.   All other systems reviewed and are negative.  Physical Exam Updated Vital Signs BP (!) 171/82   Pulse 91   Temp 97.9 F (36.6 C) (Oral)   Resp 16   SpO2 99%   Physical Exam Vitals and nursing note reviewed.  Constitutional:      General: She is not in acute distress.    Appearance: She is well-developed. She is not ill-appearing or diaphoretic.  HENT:     Head: Normocephalic and atraumatic.     Jaw: There is normal jaw occlusion.  Eyes:     Pupils: Pupils are equal, round, and reactive to light.  Neck:     Vascular: No carotid bruit or JVD.     Trachea: Trachea and phonation normal.  Cardiovascular:     Rate and Rhythm: Normal rate.     Pulses: Normal pulses.          Radial pulses are 2+ on the right side and 2+ on the left side.       Posterior tibial pulses are 2+ on the right side and 2+ on the left side.     Heart sounds: Normal heart sounds.  Pulmonary:     Effort: Pulmonary effort is normal. No tachypnea or respiratory distress.     Breath sounds: Normal breath sounds and air entry.     Comments: Speaks in full sentences without difficulty. Abdominal:     General: Bowel sounds are normal. There is no distension.     Palpations: Abdomen is soft.  Musculoskeletal:        General: Normal range of motion.     Cervical back: Full passive range of motion without pain, normal range of motion and neck supple.     Comments: No edema, erythema or warmth.  Compartments soft.  Homans negative  Skin:    General: Skin is warm and dry.     Comments: Brisk capillary refill  Neurological:     Mental Status: She is alert.     Comments: Ambulatory in room.  Follows commands.    ED Results / Procedures / Treatments   Labs (all labs ordered are listed, but only abnormal results are  displayed) Labs Reviewed  BASIC METABOLIC PANEL - Abnormal; Notable for the following components:      Result Value   CO2 21 (*)    Glucose, Bld 122 (*)    BUN 31 (*)    Creatinine, Ser 1.93 (*)    GFR calc non Af Amer 26 (*)    GFR calc Af Amer 30 (*)    All other components within normal limits  CBC WITH DIFFERENTIAL/PLATELET  URINALYSIS, ROUTINE W REFLEX MICROSCOPIC  TROPONIN I (HIGH SENSITIVITY)  TROPONIN I (HIGH SENSITIVITY)    EKG  EKG Interpretation  Date/Time:  Thursday August 15 2019 08:56:15 EST Ventricular Rate:  99 PR Interval:  166 QRS Duration: 82 QT Interval:  374 QTC Calculation: 479 R Axis:   -27 Text Interpretation: Sinus rhythm with Premature supraventricular complexes Cannot rule out Anterior infarct , age undetermined Abnormal ECG Since last tracing rate faster Confirmed by Noemi Chapel [phone]) on 08/15/2019 9:30:27 AM   Radiology DG Chest 2 View  Result Date: 08/15/2019 CLINICAL DATA:  Chest pain EXAM: CHEST - 2 VIEW COMPARISON:  04/21/2013 FINDINGS: Stable cardiomegaly. Calcific aortic knob. Minimal linear atelectasis within the left mid lung. No focal airspace consolidation. No pleural effusion or pneumothorax. IMPRESSION: No active cardiopulmonary disease. Electronically Signed   By: Davina Poke D.O.   On: 08/15/2019 10:38    Procedures Procedures (including critical care time)  Echocardiogram 03/05/2019: Left ventricle cavity is normal in size. Moderate concentric hypertrophy of the left ventricle. Normal LV systolic function with EF 55%. Normal global wall motion. Doppler evidence of grade I (impaired) diastolic dysfunction, normal LAP. Left atrial cavity is mildly dilated. 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 Trileaflet aortic valve. Trace aortic regurgitation. Mild tricuspid regurgitation.  No evidence of pulmonary hypertension.  Carotid artery duplex 03/05/2019: No hemodynamically  significant arterial disease in the internal carotid artery bilaterally. Minimal homogeneous plaque noted bilaterally.  Antegrade right vertebral artery flow. Antegrade left vertebral artery flow.  Medications Ordered in ED Medications  sodium chloride 0.9 % bolus 1,000 mL (1,000 mLs Intravenous New Bag/Given 08/15/19 1130)    ED Course  I have reviewed the triage vital signs and the nursing notes.  Pertinent labs & imaging results that were available during my care of the patient were reviewed by me and considered in my medical decision making (see chart for details).  69 year old presents for evaluation of CP. Pain began yesterday around lunchtime with associated burping.  No associated diaphoresis, nausea, vomiting, dizziness or lightheadedness.  No radiation of pain to left arm, left jaw.  Heart and lungs clear.  Compartments soft.  Bevelyn Buckles' sign negative.  No evidence of DVT on exam.  Chest pain nonexertional nonpleuritic in nature.  Prior cardiology note states they did not think her prior episode of chest pain was ACS related.  They did not recommend any stress test or heart cath.  She has good pulses bilaterally, pain is not pleuritic.   Heart score- 4 (Age, Risk factors) However HPI does not seem consistent with ACS. Wells- Lows criteria  CBC without leukocytosis Metabolic panel creatinine 1.93, lasting computer 0.9.  Patient states she is followed with [facility] and has an appointment for follow-up in the next few weeks.  States she is at her baseline. Troponin XIV.  Low suspicion for ACS, given timeline would predict troponin to be elevated if her pain was likely ACS related.  Have low suspicion for dissection, PE, myocarditis, pneumothorax or bacterial process.  Patient story seems more consistent with GERD.   Patient is to be discharged with recommendation to follow up with PCP in regards to today's hospital visit. Chest pain is not likely of cardiac or pulmonary  etiology d/t presentation, PERC negative, VSS, no tracheal deviation, no JVD or new murmur, RRR, breath sounds equal bilaterally, EKG without acute abnormalities, negative troponin, and negative CXR. Pt has been advised to return to the ED if CP becomes exertional, associated with diaphoresis or nausea, radiates to left jaw/arm, worsens or becomes concerning in any  way. Pt appears reliable for follow up and is agreeable to discharge.   Case has been discussed with and seen by Dr. Sabra Heck who agrees with the above plan to discharge.     MDM Rules/Calculators/A&P                       Final Clinical Impression(s) / ED Diagnoses Final diagnoses:  Precordial pain    Rx / DC Orders ED Discharge Orders    None       Garnell Phenix A, PA-C 08/15/19 1152    Noemi Chapel, MD 08/16/19 2059

## 2019-08-15 NOTE — ED Notes (Signed)
Got patient on the monitor patient is resting with call bell in reach  ?

## 2019-08-15 NOTE — ED Notes (Signed)
Patient transported to XR. 

## 2019-08-15 NOTE — ED Notes (Signed)
Pt walking to restroom now

## 2019-08-15 NOTE — ED Notes (Signed)
Went over d/c paperwork with pt. Pt expressed understanding. All questions answered. Computer not working in room, unable to get signature.

## 2019-08-15 NOTE — Discharge Instructions (Signed)
Return to emergency department if you have any new or worsening symptoms.

## 2019-08-15 NOTE — ED Triage Notes (Signed)
Pt had CP that started yesterday, intermittent still today. Denies n/v dizziness

## 2019-08-15 NOTE — ED Provider Notes (Signed)
This patient is a 69 year old female with no known history of heart disease, she is followed up with cardiology in the past but is not had any stress test, chest discomforts were thought to be atypical and noncardiac in origin.  She presents with several days of a discomfort in her upper chest with increased amounts of belching.  On exam she has clear heart and lung sounds, EKG is nonischemic, troponin pending, anticipate discharge given the length of time she has had symptoms if negative.  If positive will consult cardiology.  Medical screening examination/treatment/procedure(s) were conducted as a shared visit with non-physician practitioner(s) and myself.  I personally evaluated the patient during the encounter.  Clinical Impression:   Final diagnoses:  None      Noemi Chapel, MD 08/16/19 2059

## 2019-11-10 ENCOUNTER — Other Ambulatory Visit: Payer: Self-pay | Admitting: Cardiology

## 2019-11-10 DIAGNOSIS — Z8673 Personal history of transient ischemic attack (TIA), and cerebral infarction without residual deficits: Secondary | ICD-10-CM

## 2019-11-10 DIAGNOSIS — I10 Essential (primary) hypertension: Secondary | ICD-10-CM

## 2019-12-25 ENCOUNTER — Encounter (HOSPITAL_COMMUNITY): Payer: Self-pay

## 2019-12-25 ENCOUNTER — Other Ambulatory Visit: Payer: Self-pay

## 2019-12-25 DIAGNOSIS — R8281 Pyuria: Secondary | ICD-10-CM | POA: Insufficient documentation

## 2019-12-25 DIAGNOSIS — Z7984 Long term (current) use of oral hypoglycemic drugs: Secondary | ICD-10-CM | POA: Insufficient documentation

## 2019-12-25 DIAGNOSIS — E162 Hypoglycemia, unspecified: Secondary | ICD-10-CM | POA: Diagnosis present

## 2019-12-25 DIAGNOSIS — Z79899 Other long term (current) drug therapy: Secondary | ICD-10-CM | POA: Diagnosis not present

## 2019-12-25 DIAGNOSIS — R8271 Bacteriuria: Secondary | ICD-10-CM | POA: Diagnosis not present

## 2019-12-25 DIAGNOSIS — I10 Essential (primary) hypertension: Secondary | ICD-10-CM | POA: Diagnosis not present

## 2019-12-25 DIAGNOSIS — E11649 Type 2 diabetes mellitus with hypoglycemia without coma: Secondary | ICD-10-CM | POA: Insufficient documentation

## 2019-12-25 LAB — CBG MONITORING, ED: Glucose-Capillary: 99 mg/dL (ref 70–99)

## 2019-12-25 NOTE — ED Triage Notes (Signed)
Pt called out EMS for hypoglycemia. Concerned that she may have accidentally doubled her insulin. 20g L hand. Sugar was 40 on arrival. She has eaten several candy bars and a couple glucose tabs.

## 2019-12-26 ENCOUNTER — Emergency Department (HOSPITAL_COMMUNITY)
Admission: EM | Admit: 2019-12-26 | Discharge: 2019-12-26 | Disposition: A | Discharge status: Home / Self Care | Payer: Medicare Other | Attending: Emergency Medicine | Admitting: Emergency Medicine

## 2019-12-26 DIAGNOSIS — R8271 Bacteriuria: Secondary | ICD-10-CM

## 2019-12-26 DIAGNOSIS — E162 Hypoglycemia, unspecified: Secondary | ICD-10-CM

## 2019-12-26 LAB — URINALYSIS, ROUTINE W REFLEX MICROSCOPIC
Bilirubin Urine: NEGATIVE
Glucose, UA: NEGATIVE mg/dL
Ketones, ur: NEGATIVE mg/dL
Nitrite: NEGATIVE
Protein, ur: 100 mg/dL — AB
Specific Gravity, Urine: 1.011 (ref 1.005–1.030)
pH: 5 (ref 5.0–8.0)

## 2019-12-26 LAB — CBG MONITORING, ED
Glucose-Capillary: 208 mg/dL — ABNORMAL HIGH (ref 70–99)
Glucose-Capillary: 217 mg/dL — ABNORMAL HIGH (ref 70–99)

## 2019-12-26 MED ORDER — FOSFOMYCIN TROMETHAMINE 3 G PO PACK
3.0000 g | PACK | Freq: Once | ORAL | Status: AC
  Administered 2019-12-26: 3 g via ORAL
  Filled 2019-12-26: qty 3

## 2019-12-26 NOTE — ED Provider Notes (Signed)
[facility] DEPT Provider Note: Georgena Spurling, MD, FACEP  CSN: [id number] MRN: [id number] ARRIVAL: 12/25/19 at [place]: [id number]/[id number]   CHIEF COMPLAINT  Hypoglycemia   HISTORY OF PRESENT ILLNESS  12/26/19 12:39 AM Dorothy Harmon is a 69 y.o. female whose blood pressure dropped yesterday evening.  She felt jittery and weak and took her blood sugar and found it to be 40.  She was given a glucose orally and has eaten several candy bars since.  Her sugar on arrival here was 99.  She does not think she may have accidentally taken her evening dose of insulin twice.    Past Medical History:  Diagnosis Date   Arthritis    Diabetes mellitus without complication (HCC) ~4627   Fibromyalgia    Hypercholesteremia    Hypertension    Seizure ([place])    h/o 1 seizure     Past Surgical History:  Procedure Laterality Date   LAPAROSCOPIC CHOLECYSTECTOMY  2003   Dr. Zella Richer; Chronic calculous cholecystitis and fatty infiltration of the liver   RECONSTRUCTION OF NOSE     cosmetic    Family History  Problem Relation Age of Onset   Heart attack Father    Diabetes Mother        alive, 59yo (11)   Hypertension Mother     Social History   Tobacco Use   Smoking status: Never Smoker   Smokeless tobacco: Never Used  Vaping Use   Vaping Use: Never used  Substance Use Topics   Alcohol use: No   Drug use: No    Prior to Admission medications   Medication Sig Start Date End Date Taking? Authorizing Provider  amLODipine (NORVASC) 5 MG tablet TAKE 1 TABLET BY MOUTH EVERY DAY 11/11/19   Patwardhan, Reynold Bowen, MD  clopidogrel (PLAVIX) 75 MG tablet Take 1 tablet (75 mg total) by mouth daily with breakfast. 04/24/13   Effie Berkshire, MD  Insulin Glargine, 1 Unit Dial, 300 UNIT/ML SOPN Inject 41 Units into the skin daily.     [provider]  irbesartan (AVAPRO) 300 MG tablet Take 300 mg by mouth every morning.     [provider]  metFORMIN (GLUCOPHAGE) 500  MG tablet Take 1 tablet (500 mg total) by mouth 2 (two) times daily with a meal. 04/24/13   Effie Berkshire, MD  nitroGLYCERIN (NITROSTAT) 0.4 MG SL tablet Place 1 tablet (0.4 mg total) under the tongue every 5 (five) minutes as needed for chest pain. 02/14/19 05/15/19  Patwardhan, Reynold Bowen, MD  rosuvastatin (CRESTOR) 20 MG tablet Take 1 tablet (20 mg total) by mouth daily. 11/11/19   Patwardhan, Reynold Bowen, MD    Allergies Morphine and related and Penicillins   REVIEW OF SYSTEMS  Negative except as noted here or in the History of Present Illness.   PHYSICAL EXAMINATION  Initial Vital Signs Blood pressure (!) 189/74, pulse (!) 102, temperature 97.8 F (36.6 C), temperature source Oral, resp. rate 16, height 5\' 4"  (1.626 m), weight 90.7 kg, SpO2 100 %.  Examination General: Well-developed, well-nourished female in no acute distress; appearance consistent with age of record HENT: normocephalic; atraumatic Eyes: pupils equal, round and reactive to light; extraocular muscles intact Neck: supple Heart: regular rate and rhythm Lungs: clear to auscultation bilaterally Abdomen: soft; nondistended; nontender; bowel sounds present Extremities: No deformity; full range of motion; pulses normal Neurologic: Awake, alert and oriented; motor function intact in all extremities and symmetric; no facial droop Skin: Warm and dry Psychiatric: Normal  mood and affect   RESULTS  Summary of this visit's results, reviewed and interpreted by myself:   EKG Interpretation  Date/Time:    Ventricular Rate:    PR Interval:    QRS Duration:   QT Interval:    QTC Calculation:   R Axis:     Text Interpretation:        Laboratory Studies: Results for orders placed or performed during the hospital encounter of 12/26/19 (from the past 24 hour(s))  CBG monitoring, ED     Status: None   Collection Time: 12/25/19 11:53 PM  Result Value Ref Range   Glucose-Capillary 99 70 - 99 mg/dL  Urinalysis, Routine w  reflex microscopic     Status: Abnormal   Collection Time: 12/26/19 12:42 AM  Result Value Ref Range   Color, Urine YELLOW YELLOW   APPearance CLEAR CLEAR   Specific Gravity, Urine 1.011 1.005 - 1.030   pH 5.0 5.0 - 8.0   Glucose, UA NEGATIVE NEGATIVE mg/dL   Hgb urine dipstick SMALL (A) NEGATIVE   Bilirubin Urine NEGATIVE NEGATIVE   Ketones, ur NEGATIVE NEGATIVE mg/dL   Protein, ur 100 (A) NEGATIVE mg/dL   Nitrite NEGATIVE NEGATIVE   Leukocytes,Ua MODERATE (A) NEGATIVE   RBC / HPF 0-5 0 - 5 RBC/hpf   WBC, UA 21-50 0 - 5 WBC/hpf   Bacteria, UA RARE (A) NONE SEEN   Squamous Epithelial / LPF 0-5 0 - 5   Mucus PRESENT   CBG monitoring, ED     Status: Abnormal   Collection Time: 12/26/19  1:43 AM  Result Value Ref Range   Glucose-Capillary 208 (H) 70 - 99 mg/dL  CBG monitoring, ED     Status: Abnormal   Collection Time: 12/26/19  2:34 AM  Result Value Ref Range   Glucose-Capillary 217 (H) 70 - 99 mg/dL   Imaging Studies: No results found.  ED COURSE and MDM  Nursing notes, initial and subsequent vitals signs, including pulse oximetry, reviewed and interpreted by myself.  Vitals:   12/25/19 2354 12/26/19 0140 12/26/19 0145  BP: (!) 189/74 (!) 180/81 (!) 174/77  Pulse: (!) 102 (!) 110 (!) 105  Resp: 16 18 15   Temp: 97.8 F (36.6 C)    TempSrc: Oral    SpO2: 100% 99% 99%  Weight: 90.7 kg    Height: 5\' 4"  (1.626 m)     Medications  fosfomycin (MONUROL) packet 3 g (has no administration in time range)    2:45 AM Patient has remained awake and alert. No hypoglycemia. Hyperglycemia likely due to candy bars eaten earlier. Urinalysis consistent with UTI although patient denies dysuria. Urine sent for culture, will give dose of fosfomycin prior to discharge.  PROCEDURES  Procedures   ED DIAGNOSES     ICD-10-CM   1. Hypoglycemia  E16.2   2. Bacteriuria with pyuria  R82.71    R82.81        Hadassah Rana, Jenny Reichmann, MD 12/26/19 [phone]

## 2019-12-27 LAB — URINE CULTURE: Culture: 40000 — AB

## 2019-12-28 NOTE — Progress Notes (Signed)
ED Antimicrobial Stewardship Positive Culture Follow Up   Dorothy Harmon is an 69 y.o. female who presented to [facility] on 12/26/2019 with a chief complaint of hypoglycemia. Per MD note, urinalysis consistent with UTI, but patient denies dysuria. Was given dose of Fosfomycin prior to discharge.    Recent Results (from the past 720 hour(s))  Urine culture     Status: Abnormal   Collection Time: 12/26/19 12:42 AM   Specimen: Urine, Clean Catch  Result Value Ref Range Status   Specimen Description   Final    URINE, CLEAN CATCH Performed at [facility], [place] [address], [place], [place] [address]    Special Requests   Final    NONE Performed at [facility], [place] [address], [place], [place] [address]    Culture (A)  Final    40,000 COLONIES/mL GROUP B STREP(S.AGALACTIAE)ISOLATED TESTING AGAINST S. AGALACTIAE NOT ROUTINELY PERFORMED DUE TO PREDICTABILITY OF AMP/PEN/VAN SUSCEPTIBILITY. Performed at [facility], [place] [address], [place], [place] [address]    Report Status 12/27/2019 FINAL  Final    Plan: No further treatment needed.   ED Provider: Alfredia Client, PA-C   Lindell Spar M 12/28/2019, 2:23 PM Clinical Pharmacist [phone]

## 2019-12-29 ENCOUNTER — Telehealth: Payer: Self-pay | Admitting: Emergency Medicine

## 2019-12-29 NOTE — Telephone Encounter (Signed)
Post ED Visit - Positive Culture Follow-up  Culture report reviewed by antimicrobial stewardship pharmacist: [place] Team []  Elenor Quinones, Pharm.D. []  Heide Guile, Pharm.D., BCPS AQ-ID []  Parks Neptune, Pharm.D., BCPS []  Alycia Rossetti, Pharm.D., BCPS []  [place], Pharm.D., BCPS, AAHIVP []  Legrand Como, Pharm.D., BCPS, AAHIVP []  Salome Arnt, PharmD, BCPS []  Johnnette Gourd, PharmD, BCPS []  Hughes Better, PharmD, BCPS []  Leeroy Cha, PharmD []  Laqueta Linden, PharmD, BCPS []  Albertina Parr, PharmD  [place] Team []  Leodis Sias, PharmD [x]  Lindell Spar, PharmD []  Royetta Asal, PharmD []  Graylin Shiver, Rph []  Rema Fendt) Glennon Mac, PharmD []  Arlyn Dunning, PharmD []  Netta Cedars, PharmD []  Dia Sitter, PharmD []  Leone Haven, PharmD []  Gretta Arab, PharmD []  Theodis Shove, PharmD []  Peggyann Juba, PharmD []  Reuel Boom, PharmD   Positive urine culture Treated with Fosfomycin, organism sensitive to the same and no further patient follow-up is required at this time.  Sandi Raveling Karlis Cregg 12/29/2019, 10:25 AM

## 2020-05-22 ENCOUNTER — Other Ambulatory Visit: Payer: Self-pay | Admitting: Cardiology

## 2020-05-22 DIAGNOSIS — Z8673 Personal history of transient ischemic attack (TIA), and cerebral infarction without residual deficits: Secondary | ICD-10-CM

## 2020-05-22 DIAGNOSIS — I10 Essential (primary) hypertension: Secondary | ICD-10-CM

## 2020-05-24 ENCOUNTER — Other Ambulatory Visit: Payer: Self-pay | Admitting: Cardiology

## 2020-05-24 DIAGNOSIS — I10 Essential (primary) hypertension: Secondary | ICD-10-CM

## 2020-05-24 DIAGNOSIS — Z8673 Personal history of transient ischemic attack (TIA), and cerebral infarction without residual deficits: Secondary | ICD-10-CM

## 2020-08-24 ENCOUNTER — Other Ambulatory Visit: Payer: Self-pay

## 2020-08-24 DIAGNOSIS — I10 Essential (primary) hypertension: Secondary | ICD-10-CM

## 2020-08-24 DIAGNOSIS — Z8673 Personal history of transient ischemic attack (TIA), and cerebral infarction without residual deficits: Secondary | ICD-10-CM

## 2020-08-24 MED ORDER — ROSUVASTATIN CALCIUM 20 MG PO TABS: 20.0000 mg | ORAL_TABLET | Freq: Every day | ORAL | 2 refills | Status: AC

## 2020-08-24 MED ORDER — ROSUVASTATIN CALCIUM 20 MG PO TABS: 20.0000 mg | ORAL_TABLET | Freq: Every day | ORAL | 2 refills | Status: DC

## 2020-08-24 MED ORDER — AMLODIPINE BESYLATE 5 MG PO TABS: 5.0000 mg | ORAL_TABLET | Freq: Every day | ORAL | 2 refills | Status: DC

## 2020-09-07 ENCOUNTER — Other Ambulatory Visit: Payer: Self-pay | Admitting: Internal Medicine

## 2020-09-09 ENCOUNTER — Other Ambulatory Visit: Payer: Self-pay | Admitting: Internal Medicine

## 2020-09-09 DIAGNOSIS — N179 Acute kidney failure, unspecified: Secondary | ICD-10-CM

## 2020-09-28 ENCOUNTER — Other Ambulatory Visit: Payer: Self-pay | Admitting: Internal Medicine

## 2020-09-28 DIAGNOSIS — Z1231 Encounter for screening mammogram for malignant neoplasm of breast: Secondary | ICD-10-CM

## 2020-10-09 ENCOUNTER — Other Ambulatory Visit: Payer: Medicare Other

## 2020-10-20 ENCOUNTER — Other Ambulatory Visit: Payer: Medicare Other

## 2020-12-02 ENCOUNTER — Ambulatory Visit
Admission: RE | Admit: 2020-12-02 | Discharge: 2020-12-02 | Disposition: A | Discharge status: Home / Self Care | Payer: Medicare Other | Source: Ambulatory Visit | Attending: Internal Medicine | Admitting: Internal Medicine

## 2020-12-02 DIAGNOSIS — N179 Acute kidney failure, unspecified: Secondary | ICD-10-CM

## 2020-12-22 ENCOUNTER — Other Ambulatory Visit: Payer: Self-pay | Admitting: Internal Medicine

## 2020-12-22 DIAGNOSIS — R9389 Abnormal findings on diagnostic imaging of other specified body structures: Secondary | ICD-10-CM

## 2021-01-07 ENCOUNTER — Ambulatory Visit
Admission: RE | Admit: 2021-01-07 | Discharge: 2021-01-07 | Disposition: A | Discharge status: Home / Self Care | Payer: Medicare Other | Source: Ambulatory Visit | Attending: Internal Medicine | Admitting: Internal Medicine

## 2021-01-07 DIAGNOSIS — R9389 Abnormal findings on diagnostic imaging of other specified body structures: Secondary | ICD-10-CM

## 2021-01-20 ENCOUNTER — Ambulatory Visit: Payer: Medicare Other | Admitting: Obstetrics and Gynecology

## 2021-03-10 ENCOUNTER — Other Ambulatory Visit (HOSPITAL_COMMUNITY)
Admission: RE | Admit: 2021-03-10 | Discharge: 2021-03-10 | Disposition: A | Discharge status: Home / Self Care | Payer: Medicare Other | Source: Ambulatory Visit | Attending: Obstetrics and Gynecology | Admitting: Obstetrics and Gynecology

## 2021-03-10 ENCOUNTER — Ambulatory Visit: Payer: Medicare Other | Admitting: Obstetrics and Gynecology

## 2021-03-10 ENCOUNTER — Encounter: Payer: Self-pay | Admitting: Obstetrics and Gynecology

## 2021-03-10 ENCOUNTER — Other Ambulatory Visit: Payer: Self-pay

## 2021-03-10 DIAGNOSIS — Z01419 Encounter for gynecological examination (general) (routine) without abnormal findings: Secondary | ICD-10-CM | POA: Diagnosis not present

## 2021-03-10 DIAGNOSIS — R9389 Abnormal findings on diagnostic imaging of other specified body structures: Secondary | ICD-10-CM

## 2021-03-10 DIAGNOSIS — N83209 Unspecified ovarian cyst, unspecified side: Secondary | ICD-10-CM

## 2021-03-10 NOTE — Addendum Note (Signed)
Addended by: Phillip Heal, Hyatt Capobianco A on: 03/10/2021 04:53 PM   Modules accepted: Orders

## 2021-03-10 NOTE — Patient Instructions (Signed)
Hysteroscopy  Hysteroscopy is a procedure used to look inside a woman's womb (uterus). This may be done for various reasons, including:  To look for tumors and other growths in the uterus.  To evaluate abnormal bleeding, fibroid tumors, polyps, scar tissue, or uterine cancer.  To determine why a woman is unable to get pregnant or has had repeated pregnancy losses.  To locate an IUD (intrauterine device).  To place a birth control device into the fallopian tubes.  During this procedure, a thin, flexible tube with a small light and camera (hysteroscope) is used to examine the uterus. The camera sends images to a monitor in the room so that your health care provider can view the inside of your uterus. A hysteroscopy should be done right after a menstrual period.  Tell a health care provider about:  Any allergies you have.  All medicines you are taking, including vitamins, herbs, eye drops, creams, and over-the-counter medicines.  Any problems you or family members have had with anesthetic medicines.  Any blood disorders you have.  Any surgeries you have had.  Any medical conditions you have.  Whether you are pregnant or may be pregnant.  Whether you have been diagnosed with an STI (sexually transmitted infection) or you think you have an STI.  What are the risks?  Generally, this is a safe procedure. However, problems may occur, including:  Excessive bleeding.  Infection.  Damage to the uterus or other structures or organs.  Allergic reaction to medicines or fluids that are used in the procedure.  What happens before the procedure?  Staying hydrated  Follow instructions from your health care provider about hydration, which may include:  Up to 2 hours before the procedure - you may continue to drink clear liquids, such as water, clear fruit juice, black coffee, and plain tea.  Eating and drinking restrictions  Follow instructions from your health care provider about eating and drinking, which may include:  8 hours  before the procedure - stop eating solid foods and drink clear liquids only.  2 hours before the procedure - stop drinking clear liquids.  Medicines  Ask your health care provider about:  Changing or stopping your regular medicines. This is especially important if you are taking diabetes medicines or blood thinners.  Taking medicines such as aspirin and ibuprofen. These medicines can thin your blood. Do not take these medicines unless your health care provider tells you to take them.  Taking over-the-counter medicines, vitamins, herbs, and supplements.  Medicine may be placed in your cervix the day before the procedure. This medicine causes the cervix to open (dilate). The larger opening makes it easier for the hysteroscope to be inserted into the uterus during the procedure.  General instructions  Ask your health care provider:  What steps will be taken to help prevent infection. These steps may include:  Washing skin with a germ-killing soap.  Taking antibiotic medicine.  Do not use any products that contain nicotine or tobacco for at least 4 weeks before the procedure. These products include cigarettes, chewing tobacco, and vaping devices, such as e-cigarettes. If you need help quitting, ask your health care provider.  Plan to have a responsible adult take you home from the hospital or clinic.  Plan to have a responsible adult care for you for the time you are told after you leave the hospital or clinic. This is important.  Empty your bladder before the procedure begins.  What happens during the procedure?  An IV   will be inserted into one of your veins.  You may be given:  A medicine to help you relax (sedative).  A medicine that numbs the area around the cervix (local anesthetic).  A medicine to make you fall asleep (general anesthetic).  A hysteroscope will be inserted through your vagina and into your uterus.  Air or fluid will be used to enlarge your uterus to allow your health care provider to see it better.  The amount of fluid used will be carefully checked throughout the procedure.  In some cases, tissue may be gently scraped from inside the uterus and sent to a lab for testing (biopsy).  The procedure may vary among health care providers and hospitals.  What can I expect after the procedure?  Your blood pressure, heart rate, breathing rate, and blood oxygen level will be monitored until you leave the hospital or clinic.  You may have cramps. You may be given medicines for this.  You may have bleeding, which may vary from light spotting to menstrual-like bleeding. This is normal.  If you had a biopsy, it is up to you to get the results. Ask your health care provider, or the department that is doing the procedure, when your results will be ready.  Follow these instructions at home:  Activity  Rest as told by your health care provider.  Return to your normal activities as told by your health care provider. Ask your health care provider what activities are safe for you.  If you were given a sedative during the procedure, it can affect you for several hours. Do not drive or operate machinery until your health care provider says that it is safe.  Medicines  Do not take aspirin or other NSAIDs during recovery, as told by your healthcare provider. It can increase the risk of bleeding.  Ask your health care provider if the medicine prescribed to you:  Requires you to avoid driving or using machinery.  Can cause constipation. You may need to take these actions to prevent or treat constipation:  Drink enough fluid to keep your urine pale yellow.  Take over-the-counter or prescription medicines.  Eat foods that are high in fiber, such as beans, whole grains, and fresh fruits and vegetables.  Limit foods that are high in fat and processed sugars, such as fried or sweet foods.  General instructions  Do not douche, use tampons, or have sex for 2 weeks after the procedure, or until your health care provider approves.  Do not take  baths, swim, or use a hot tub until your health care provider approves. Take showers instead of baths for 2 weeks, or for as long as told by your health care provider.  Keep all follow-up visits. This is important.  Contact a health care provider if:  You feel dizzy or lightheaded.  You feel nauseous.  You have abnormal vaginal discharge.  You have a rash.  You have pain that does not get better with medicine.  You have chills.  Get help right away if:  You have bleeding that is heavier than a normal menstrual period.  You have a fever.  You have pain or cramps that get worse.  You develop new abdominal pain.  You faint.  You have pain in your shoulder.  You are short of breath.  Summary  Hysteroscopy is a procedure that is used to look inside a woman's womb (uterus).  After the procedure, you may have bleeding, which varies from light spotting to   menstrual-like bleeding. This is normal. You may also have cramps.  Do not douche, use tampons, or have sex for 2 weeks after the procedure, or until your health care provider approves.  Plan to have a responsible adult take you home from the hospital or clinic.  This information is not intended to replace advice given to you by your health care provider. Make sure you discuss any questions you have with your health care provider.  Document Revised: 01/22/2020 Document Reviewed: 01/22/2020  Elsevier Patient Education  2022 [employer]

## 2021-03-10 NOTE — Progress Notes (Signed)
Ms Tolento presents for eval of thicken endometrium noted on U/S.  LMP was over 20 yrs ago. Denies any PMB. Not sexual active. Right ovarian cyst was also noted. Denies any bowel or bladder dysfunction Mammogram UTD Last pap ?  PE AF VSS   Chaperone present during exam  Lungs clear  Heart RRR Abd soft + BS  GU Nl EGBUS, vaginal atrophy, cervix no lesion, uterus small, no masses or tenderness, exam limited by pt habitus.  A/P Thicken endometrium        Ovarian cyst        Pap smear  Pap smear completed today. U/S findings reviewed with pt. Hysteroscopy D & C recommended to pt. R/B/Post op care reviewed with pt. Pt verbalized understanding and desires to proceed. Will schedule. F/U GYN U/S next month. F/U in office with post op visit

## 2021-03-10 NOTE — Progress Notes (Signed)
Patient is here to discuss her endometrial mass. She is unsure of her last pap smear.  Mammo order in place. Patient to call and get reschedule.

## 2021-03-11 ENCOUNTER — Other Ambulatory Visit: Payer: Self-pay | Admitting: Obstetrics and Gynecology

## 2021-03-11 DIAGNOSIS — N83209 Unspecified ovarian cyst, unspecified side: Secondary | ICD-10-CM

## 2021-03-12 LAB — CYTOLOGY - PAP
Comment: NEGATIVE
Diagnosis: NEGATIVE
Trichomonas: NEGATIVE

## 2021-03-16 NOTE — Progress Notes (Signed)
TC to patient. Notified of normal PAP.

## 2021-03-17 ENCOUNTER — Telehealth: Payer: Self-pay | Admitting: *Deleted

## 2021-03-17 NOTE — H&P (Signed)
Dorothy Harmon is an 70 y.o. female with thicken endometrium noted on U/S. No PMB. LMP over 20 yrs ago.  Pap smear UTD  Menstrual History: Menarche age: 49 No LMP recorded. Patient is postmenopausal.    Past Medical History:  Diagnosis Date   Arthritis    Diabetes mellitus without complication ([place]) [id number]   Fibromyalgia    Hypercholesteremia    Hypertension    Seizure ([place])    h/o 1 seizure     Past Surgical History:  Procedure Laterality Date   LAPAROSCOPIC CHOLECYSTECTOMY  2003   Dr. Zella Richer; Chronic calculous cholecystitis and fatty infiltration of the liver   RECONSTRUCTION OF NOSE     cosmetic    Family History  Problem Relation Age of Onset   Heart attack Father    Diabetes Mother        alive, 64yo (66)   Hypertension Mother     Social History:  reports that she has never smoked. She has never used smokeless tobacco. She reports that she does not drink alcohol and does not use drugs.  Allergies:  Allergies  Allergen Reactions   Morphine And Related Other (See Comments)    Loss of consciousness, feeling sick, faint   Penicillins Itching and Swelling    Did it involve swelling of the face/tongue/throat, SOB, or low BP? N Did it involve sudden or severe rash/hives, skin peeling, or any reaction on the inside of your mouth or nose? N Did you need to seek medical attention at a hospital or doctor's office? N When did it last happen?   45 years ago    If all above answers are "NO", may proceed with cephalosporin use.     No medications prior to admission.    Review of Systems  Constitutional: Negative.   Respiratory: Negative.    Cardiovascular: Negative.   Gastrointestinal: Negative.   Genitourinary: Negative.    There were no vitals taken for this visit. Physical Exam Constitutional:      Appearance: Normal appearance.  Cardiovascular:     Rate and Rhythm: Normal rate and regular rhythm.  Pulmonary:     Effort: Pulmonary effort is normal.      Breath sounds: Normal breath sounds.  Abdominal:     General: Bowel sounds are normal.     Palpations: Abdomen is soft.  Genitourinary:    Comments: Defer to OR Neurological:     Mental Status: She is alert.    No results found for this or any previous visit (from the past 24 hour(s)).  No results found.  Assessment/Plan: Thicken endometrium  Hysteroscopy D & C has been recommended to the pt for further evaluation. R/B/Post op have been reviewed and pt desires to proceed.   [name] 03/17/2021, 3:40 PM

## 2021-03-17 NOTE — Telephone Encounter (Signed)
Call to patient regarding surgery dates. Desires to proceed on 03-24-21. Scheduled for 03-24-21 at 1100 at [facility], arrive at 900 am. Surgery instructions reviewed- states she does not take Plavix ( seen in Epic). Advised pre-op nurse will instruct on medication does and clear liquids.   Encounter closed.

## 2021-03-19 ENCOUNTER — Encounter (HOSPITAL_BASED_OUTPATIENT_CLINIC_OR_DEPARTMENT_OTHER): Payer: Self-pay | Admitting: Obstetrics and Gynecology

## 2021-03-19 ENCOUNTER — Other Ambulatory Visit: Payer: Self-pay

## 2021-03-19 NOTE — Progress Notes (Signed)
Spoke w/ via phone for pre-op interview--- pt Lab needs dos---- cbc, istat, ekg              Lab results------ no COVID test -----patient states asymptomatic no test needed Arrive at ------- 0915 on 03-24-2021 NPO after MN NO Solid Food.  Clear liquids from MN until--- 0815 Med rec completed Medications to take morning of surgery ----- none Diabetic medication ----- do half insulin dose night before surgery Patient instructed no nail polish to be worn day of surgery Patient instructed to bring photo id and insurance card day of surgery  Patient aware to have Driver (ride ) / caregiver  for 24 hours after surgery -- pt stated unaware she had to have responsible person to drive her home or be with her if she took uber/ taxi and she had to have a caregiver . Stated her son is out of the country and her daughter is in the hospital and unsure if she will be out prior to surgery. Advised pt she may need to reschedule, she verbalized understanding .  Patient Special Instructions ----- pt verbalized understanding that I will be calling her on Tuesday 03-23-2021 with instructions if she is still having surgery on 03-24-2021.  Pre-Op special Istructions ----- called and requested pt's pcp, dr Lenetta Quaker note / lab results, received via fax and placed with chart. Also, faxed a requested to pt nephrologist, dr Johnney Ou, for lov note done today (have not revoed yet). Also,  sent inbox message to Dr Rip Harbour and OR scheduler, Jaymes Graff, in epic.  Informed them that pt issue with no drive/ caregiver dos and that pt is taking daily blood thinner, plavix, will need clearance if pt needs to stop prior to surgery which is managed by pcp.  Patient verbalized understanding of instructions that were given at this phone interview. Patient denies shortness of breath, chest pain, fever, cough at this phone interview.    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  PCP: Dr Osborne Casco Cassell Clement [id number] with chart) Cardiologist : Dr Robb Matar Cassell Clement 03-18-2019 epic) Nephrologist:  Dr Johnney Ou (lov per pt today, requested note) Chest x-ray : 08-15-2019 epic EKG : 08-15-2019 epic Echo : 03-05-2019 epic Stress test: no Cardiac Cath :  12-01-2003 epic Activity level: denies sob w/ any activity Sleep Study/ CPAP : Yes/ No Fasting Blood Sugar :  90s    / Checks Blood Sugar -- times a day:  daily in am  Blood Thinner/ Instructions /Last Dose: Plavix ASA / Instructions/ Last Dose :  no Per pt was not given any instructions to stop or continue prior to surgery, told pt she will need instructions from dr Rip Harbour office if to stop or not.

## 2021-03-19 NOTE — Progress Notes (Signed)
Called and left voice message w/ pt's pcp,Dr Tisovec's assistance, Estill Bamberg RMA requested lov note and last lab results and name of pt's nephrologist if she is followed by one to be faxed. Requested since noted in epic pt has CKD 4 and need to know if he manages her plavix.

## 2021-03-24 ENCOUNTER — Ambulatory Visit (HOSPITAL_BASED_OUTPATIENT_CLINIC_OR_DEPARTMENT_OTHER): Admit: 2021-03-24 | Payer: Medicare Other | Admitting: Obstetrics and Gynecology

## 2021-03-24 HISTORY — DX: Chronic kidney disease, stage 4 (severe): N18.4

## 2021-03-24 HISTORY — DX: Long term (current) use of anticoagulants: Z79.01

## 2021-03-24 HISTORY — DX: Irritable bowel syndrome, unspecified: K58.9

## 2021-03-24 HISTORY — DX: Major depressive disorder, single episode, unspecified: F32.9

## 2021-03-24 HISTORY — DX: Chronic diastolic (congestive) heart failure: I50.32

## 2021-03-24 HISTORY — DX: Polyneuropathy, unspecified: G62.9

## 2021-03-24 HISTORY — DX: Long term (current) use of insulin: E11.9

## 2021-03-24 HISTORY — DX: Abnormal findings on diagnostic imaging of other specified body structures: R93.89

## 2021-03-24 HISTORY — DX: Hypertensive retinopathy, bilateral: H35.033

## 2021-03-24 HISTORY — DX: Personal history of urinary calculi: Z87.442

## 2021-03-24 HISTORY — DX: Unspecified osteoarthritis, unspecified site: M19.90

## 2021-03-24 HISTORY — DX: Obstructive sleep apnea (adult) (pediatric): G47.33

## 2021-03-24 SURGERY — DILATATION AND CURETTAGE /HYSTEROSCOPY
Anesthesia: Choice

## 2021-04-09 ENCOUNTER — Other Ambulatory Visit: Payer: Medicare Other

## 2021-05-19 ENCOUNTER — Telehealth: Payer: Self-pay

## 2021-05-19 NOTE — Telephone Encounter (Addendum)
Spoke with pt to see if she wanted to reschedule surgery and pt state she is unsure if she wants to reschedule. Pt states, "I think the cyst has revolved, I had four days of bleeding and no issues since". Pt states she is also waiting to hear from her doctor's office to see if she can stop taking blood thinners. I offered appt with Dr.Ervin to discuss concerns and pt declined appointment. Pt states she has too many appointments going on right now and she will call office when she is ready to schedule an appt/surgery.  ----- Message from [name], MD sent at 05/19/2021  2:31 PM EST ----- Please contact pt to see if she desires to reschedule her surgery. Thanks Legrand Como

## 2021-06-08 ENCOUNTER — Other Ambulatory Visit: Payer: Self-pay

## 2021-06-08 DIAGNOSIS — N189 Chronic kidney disease, unspecified: Secondary | ICD-10-CM

## 2021-06-24 ENCOUNTER — Other Ambulatory Visit: Payer: Self-pay | Admitting: Cardiology

## 2021-06-24 DIAGNOSIS — Z8673 Personal history of transient ischemic attack (TIA), and cerebral infarction without residual deficits: Secondary | ICD-10-CM

## 2021-06-24 NOTE — Progress Notes (Signed)
Office Note     CC:  ESRD Requesting Provider:  Tisovec, Fransico Him, MD  HPI: Dorothy Harmon is a Right handed 71 y.o. (06-14-1951) female with kidney disease who presents at the request of Tisovec, Fransico Him, MD for permanent HD access. The patient has had no prior access procedures. No lines, no dialysis.  Patient is a native of [place] and works for [employer] in Therapist, art. She has 2 children, both not married, who still live in the area.  On exam, patient was doing well.  She had no complaints.  She is aware her kidneys are slowly failing.  We discussed hemodialysis as well as fistula creation.  The pt is  on a statin for cholesterol management.  The pt is not on a daily aspirin.   Other AC:  Plavix The pt is on medications for hypertension.   The pt is not diabetic. Tobacco hx:  -  Past Medical History:  Diagnosis Date   Anticoagulant long-term use    plavix--- managed by pcp, dr Osborne Casco   Chronic diastolic CHF (congestive heart failure) ([place])    last echo in epic 03-04-2021  ef 55%, G1DD, mild TR   CKD (chronic kidney disease), stage IV [facility])    nephrologist-- dr Johnney Ou   Fibromyalgia    History of CVA (cerebrovascular accident) 04/21/2013   hostpiral admission -- acute left thalamus infarct and imaging showed previous old small vessel infart right cerebullum   History of kidney stones    History of seizure 12/2006   h/o 1 seizure    Hypercholesteremia    Hypertension    Hypertensive retinopathy of both eyes    IBS (irritable bowel syndrome)    MDD (major depressive disorder)    OA (osteoarthritis)    OSA (obstructive sleep apnea)    per pt had sleep many yrs ago, intolerant to cpap   Peripheral neuropathy    Thickened endometrium    Type 2 diabetes mellitus treated with insulin ([place])    followed by pcp---  (03-19-2021 per pt checks daily in am,  fasting sugar-- 90s)    Past Surgical History:  Procedure Laterality Date   CARDIAC CATHETERIZATION   12/01/2003   @[facility]  by dr [place];  normal coronaries, normal lvf, ef 65%, mild to moderate pulm htn (cath done for positive cardiolite)   LAPAROSCOPIC CHOLECYSTECTOMY  04/18/2002   @[facility]    RHINOPLASTY  1974   TUBAL LIGATION Bilateral    yrs ago   WRIST FRACTURE SURGERY  1992    Social History   Socioeconomic History   Marital status: Divorced    Spouse name: Not on file   Number of children: 2   Years of education: 12   Highest education level: Not on file  Occupational History   Occupation: customer service    Comment: [employer]  Tobacco Use   Smoking status: Never   Smokeless tobacco: Never  Vaping Use   Vaping Use: Never used  Substance and Sexual Activity   Alcohol use: No   Drug use: No   Sexual activity: Not on file  Other Topics Concern   Not on file  Social History Narrative   Mother lives with her; divorced   Retired    Education   Right handed.   Caffeine- None    Social Determinants of Health   Financial Resource Strain: Not on file  Food Insecurity: Not on file  Transportation Needs: Not on file  Physical Activity: Not on file  Stress: Not on file  Social Connections: Not on file  Intimate Partner Violence: Not on file    Family History  Problem Relation Age of Onset   Heart attack Father    Diabetes Mother        alive, 34yo (50)   Hypertension Mother     Current Outpatient Medications  Medication Sig Dispense Refill   amLODipine (NORVASC) 5 MG tablet Take 1 tablet (5 mg total) by mouth daily. (Patient taking differently: Take 5 mg by mouth every evening.) 90 tablet 2   clopidogrel (PLAVIX) 75 MG tablet Take 1 tablet (75 mg total) by mouth daily with breakfast. (Patient taking differently: Take 75 mg by mouth every evening.) 30 tablet 2   insulin glargine, 2 Unit Dial, (TOUJEO MAX SOLOSTAR) 300 UNIT/ML Solostar Pen Inject 40 Units into the skin at bedtime.     irbesartan (AVAPRO) 300 MG tablet Take 300 mg by mouth every evening.      rosuvastatin (CRESTOR) 20 MG tablet Take 1 tablet (20 mg total) by mouth daily. (Patient taking differently: Take 20 mg by mouth at bedtime.) 90 tablet 2   No current facility-administered medications for this visit.    Allergies  Allergen Reactions   Ace Inhibitors Other (See Comments)    cough   Codeine Itching and Swelling   Morphine And Related Other (See Comments)    Loss of consciousness, feeling sick, faint   Penicillins Itching and Swelling    Did it involve swelling of the face/tongue/throat, SOB, or low BP? N Did it involve sudden or severe rash/hives, skin peeling, or any reaction on the inside of your mouth or nose? N Did you need to seek medical attention at a hospital or doctor's office? N When did it last happen?   45 years ago    If all above answers are "NO", may proceed with cephalosporin use.      REVIEW OF SYSTEMS:   [X]  denotes positive finding, [ ]  denotes negative finding Cardiac  Comments:  Chest pain or chest pressure:    Shortness of breath upon exertion:    Short of breath when lying flat:    Irregular heart rhythm:        Vascular    Pain in calf, thigh, or hip brought on by ambulation:    Pain in feet at night that wakes you up from your sleep:     Blood clot in your veins:    Leg swelling:         Pulmonary    Oxygen at home:    Productive cough:     Wheezing:         Neurologic    Sudden weakness in arms or legs:     Sudden numbness in arms or legs:     Sudden onset of difficulty speaking or slurred speech:    Temporary loss of vision in one eye:     Problems with dizziness:         Gastrointestinal    Blood in stool:     Vomited blood:         Genitourinary    Burning when urinating:     Blood in urine:        Psychiatric    Major depression:         Hematologic    Bleeding problems:    Problems with blood clotting too easily:        Skin    Rashes or ulcers:  Constitutional    Fever or chills:      PHYSICAL  EXAMINATION:  There were no vitals filed for this visit.  General:  WDWN in NAD; vital signs documented above Gait: Not observed HENT: WNL, normocephalic Pulmonary: normal non-labored breathing , without Rales, rhonchi,  wheezing Cardiac: regular HR Abdomen: soft, NT, no masses Skin: with rashes Vascular Exam/Pulses:  Right Left  Radial 2+ (normal) 2+ (normal)  Ulnar 2+ (normal) 2+ (normal)                   Extremities: without ischemic changes, without Gangrene , without cellulitis; without open wounds;  Musculoskeletal: no muscle wasting or atrophy  Neurologic: A&O X 3;  No focal weakness or paresthesias are detected Psychiatric:  The pt has Normal affect.   Non-Invasive Vascular Imaging:    +-----------------+-------------+----------+---------+   Right Cephalic    Diameter (cm) Depth (cm) Findings    +-----------------+-------------+----------+---------+   Shoulder              0.18         1.81                +-----------------+-------------+----------+---------+   Prox upper arm        0.20         1.58                +-----------------+-------------+----------+---------+   Mid upper arm         0.17         0.81                +-----------------+-------------+----------+---------+   Dist upper arm        0.20         0.54                +-----------------+-------------+----------+---------+   Antecubital fossa     0.28         0.27                +-----------------+-------------+----------+---------+   Prox forearm          0.22         0.70    branching   +-----------------+-------------+----------+---------+   Mid forearm           0.26         0.65                +-----------------+-------------+----------+---------+   Dist forearm          0.18         0.48                +-----------------+-------------+----------+---------+   Wrist                 0.20         0.29                +-----------------+-------------+----------+---------+    +-----------------+-------------+----------+---------+   Right Basilic     Diameter (cm) Depth (cm) Findings    +-----------------+-------------+----------+---------+   Mid upper arm         0.45         1.52                +-----------------+-------------+----------+---------+   Dist upper arm        0.45         1.25    branching   +-----------------+-------------+----------+---------+   Antecubital fossa  0.43         1.04    branching   +-----------------+-------------+----------+---------+   Prox forearm          0.23         0.68                +-----------------+-------------+----------+---------+   Mid forearm           0.19         0.59                +-----------------+-------------+----------+---------+   Distal forearm        0.19         0.39                +-----------------+-------------+----------+---------+   +-----------------+-------------+----------+---------+   Left Cephalic     Diameter (cm) Depth (cm) Findings    +-----------------+-------------+----------+---------+   Shoulder              0.16         0.91                +-----------------+-------------+----------+---------+   Prox upper arm        0.19         0.71                +-----------------+-------------+----------+---------+   Mid upper arm         0.20         0.51                +-----------------+-------------+----------+---------+   Dist upper arm        0.23         0.39    branching   +-----------------+-------------+----------+---------+   Antecubital fossa     0.22         0.51                +-----------------+-------------+----------+---------+   Prox forearm          0.20         0.48                +-----------------+-------------+----------+---------+   Mid forearm           0.19         0.52                +-----------------+-------------+----------+---------+   Dist forearm          0.20         0.49                +-----------------+-------------+----------+---------+    Wrist                 0.16         0.48                +-----------------+-------------+----------+---------+   +-----------------+-------------+----------+---------+   Left Basilic      Diameter (cm) Depth (cm) Findings    +-----------------+-------------+----------+---------+   Prox upper arm        0.37         1.60                +-----------------+-------------+----------+---------+   Mid upper arm         0.33         1.00                +-----------------+-------------+----------+---------+   Dist  upper arm        0.26         0.84    branching   +-----------------+-------------+----------+---------+   Antecubital fossa     0.32         0.25                +-----------------+-------------+----------+---------+   Prox forearm          0.15         0.47    branching   +-----------------+-------------+----------+---------+   Mid forearm           0.22         0.90                +-----------------+-------------+----------+---------+   Distal forearm        0.16         0.92                +-----------------+-------------+----------+---------+   Wrist                 0.18         0.71    branching   +-----------------+-------------+----------+---------+     ASSESSMENT/PLAN:  Jaleya Pebley is a 71 y.o. female who presents with chronic kidney disease.  Based on vein mapping and examination, the vein is a candidate for brachiobasilic fistula creation in the left or right upper extremity. Being that Klohe is right-handed, we discussed left-sided brachiobasilic fistula creation I had an extensive discussion with this patient in regards to the nature of access surgery, including risk, benefits, and alternatives.   The patient is aware that the risks of access surgery include but are not limited to: bleeding, infection, steal syndrome, nerve damage, ischemic monomelic neuropathy, failure of access to mature, complications related to venous hypertension, and possible need for additional  access procedures in the future. I discussed with the patient the nature of the staged access procedure, specifically the need for a second operation to transpose the first stage fistula if it matures adequately.   The patient has agreed to proceed with the above procedure which will be scheduled at her convenience in the coming weeks.Broadus John, MD [facility] [phone]

## 2021-06-25 ENCOUNTER — Other Ambulatory Visit: Payer: Self-pay

## 2021-06-25 ENCOUNTER — Encounter: Payer: Self-pay | Admitting: Vascular Surgery

## 2021-06-25 ENCOUNTER — Ambulatory Visit (INDEPENDENT_AMBULATORY_CARE_PROVIDER_SITE_OTHER): Payer: Medicare Other | Admitting: Vascular Surgery

## 2021-06-25 ENCOUNTER — Ambulatory Visit (HOSPITAL_COMMUNITY)
Admission: RE | Admit: 2021-06-25 | Discharge: 2021-06-25 | Disposition: A | Discharge status: Home / Self Care | Payer: Medicare Other | Source: Ambulatory Visit | Attending: Vascular Surgery | Admitting: Vascular Surgery

## 2021-06-25 ENCOUNTER — Ambulatory Visit (INDEPENDENT_AMBULATORY_CARE_PROVIDER_SITE_OTHER)
Admission: RE | Admit: 2021-06-25 | Discharge: 2021-06-25 | Disposition: A | Discharge status: Home / Self Care | Payer: Medicare Other | Source: Ambulatory Visit | Attending: Vascular Surgery | Admitting: Vascular Surgery

## 2021-06-25 VITALS — BP 137/77 | HR 95 | Temp 98.2°F | Resp 20 | Ht 64.0 in | Wt 197.0 lb

## 2021-06-25 DIAGNOSIS — N83209 Unspecified ovarian cyst, unspecified side: Secondary | ICD-10-CM | POA: Diagnosis not present

## 2021-06-25 DIAGNOSIS — N189 Chronic kidney disease, unspecified: Secondary | ICD-10-CM | POA: Diagnosis not present

## 2021-07-05 ENCOUNTER — Encounter (HOSPITAL_COMMUNITY): Payer: Self-pay | Admitting: Vascular Surgery

## 2021-07-05 ENCOUNTER — Other Ambulatory Visit: Payer: Self-pay

## 2021-07-05 NOTE — Anesthesia Preprocedure Evaluation (Addendum)
Anesthesia Evaluation  Patient identified by MRN, date of birth, ID band Patient awake    Reviewed: Allergy & Precautions, NPO status , Patient's Chart, lab work & pertinent test results  History of Anesthesia Complications Negative for: history of anesthetic complications  Airway Mallampati: III  TM Distance: >3 FB Neck ROM: Full    Dental  (+) Dental Advisory Given, Teeth Intact   Pulmonary sleep apnea ,    Pulmonary exam normal        Cardiovascular hypertension, Pt. on medications Normal cardiovascular exam   '20 TTE - Moderate concentric LVH. EF 55%. Grade I (impaired) diastolic dysfunction, normal LAP. Left atrial cavity is mildly dilated. 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  Trace AI. Mild tricuspid regurgitation.     Neuro/Psych Seizures -, Well Controlled,  PSYCHIATRIC DISORDERS Depression  Neuromuscular disease CVA (memory issues), Residual Symptoms    GI/Hepatic negative GI ROS, Neg liver ROS,   Endo/Other  diabetes, Type 2, Insulin Dependent Obesity   Renal/GU ESRFRenal disease     Musculoskeletal  (+) Arthritis , Fibromyalgia -  Abdominal   Peds  Hematology  On plavix    Anesthesia Other Findings   Reproductive/Obstetrics                            Anesthesia Physical Anesthesia Plan  ASA: 4  Anesthesia Plan: Regional   Post-op Pain Management: Regional block and Tylenol PO (pre-op)   Induction:   PONV Risk Score and Plan: 2 and Propofol infusion and Treatment may vary due to age or medical condition  Airway Management Planned: Natural Airway and Simple Face Mask  Additional Equipment: None  Intra-op Plan:   Post-operative Plan:   Informed Consent: I have reviewed the patients History and Physical, chart, labs and discussed the procedure including the risks, benefits and alternatives for the proposed  anesthesia with the patient or authorized representative who has indicated his/her understanding and acceptance.       Plan Discussed with: CRNA, Anesthesiologist and Surgeon  Anesthesia Plan Comments:        Anesthesia Quick Evaluation

## 2021-07-05 NOTE — Progress Notes (Signed)
PCP - Dr. Osborne Casco Cardiologist - denies EKG - DOS Chest x-ray - n/a ECHO - 03-05-2019 Cardiac Cath - 12-01-2003 CPAP - sleep apnea, unable to tolerate CPAP Fasting Blood Sugar:  60-100's Checks Blood Sugar:  once/day Blood Thinner Instructions: Plavix, LD per surgeon 06-30-21 Aspirin Instructions: n/a ERAS Protcol - clears until 430 COVID TEST- no-ambulatory  Anesthesia review: no  -------------  SDW INSTRUCTIONS:  Your procedure is scheduled on January 17. Please report to [facility] Main Entrance "A" at 05:30 A.M., and check in at the Admitting office. Call this number if you have problems the morning of surgery: [phone]   Remember: Do not eat after midnight the night before your surgery  You may drink clear liquids until 4:30am the morning of your surgery.   Clear liquids allowed are: Water, Non-Citrus Juices (without pulp), Carbonated Beverages, Clear Tea, Black Coffee Only, and Gatorade   Medications to take morning of surgery with a sip of water include: amlodipine  As of today, STOP taking any Aspirin (unless otherwise instructed by your surgeon), Aleve, Naproxen, Ibuprofen, Motrin, Advil, Goody's, BC's, all herbal medications, fish oil, and all vitamins.  WHAT DO I DO ABOUT MY DIABETES MEDICATION?   Do not take oral diabetes medicines (pills) the morning of surgery.  THE NIGHT BEFORE SURGERY, take 1/2 dose of Toujeo (20units) tonight   HOW TO MANAGE YOUR DIABETES BEFORE AND AFTER SURGERY  Why is it important to control my blood sugar before and after surgery? Improving blood sugar levels before and after surgery helps healing and can limit problems. A way of improving blood sugar control is eating a healthy diet by:  Eating less sugar and carbohydrates  Increasing activity/exercise  Talking with your doctor about reaching your blood sugar goals High blood sugars (greater than 180 mg/dL) can raise your risk of infections and slow your recovery, so you will  need to focus on controlling your diabetes during the weeks before surgery. Make sure that the doctor who takes care of your diabetes knows about your planned surgery including the date and location.  How do I manage my blood sugar before surgery? Check your blood sugar at least 4 times a day, starting 2 days before surgery, to make sure that the level is not too high or low.  Check your blood sugar the morning of your surgery when you wake up and every 2 hours until you get to the Short Stay unit.  If your blood sugar is less than 70 mg/dL, you will need to treat for low blood sugar: Do not take insulin. Treat a low blood sugar (less than 70 mg/dL) with  cup of clear juice (cranberry or apple), 4 glucose tablets, OR glucose gel. Recheck blood sugar in 15 minutes after treatment (to make sure it is greater than 70 mg/dL). If your blood sugar is not greater than 70 mg/dL on recheck, call [phone] for further instructions. Report your blood sugar to the short stay nurse when you get to Short Stay.  If you are admitted to the hospital after surgery: Your blood sugar will be checked by the staff and you will probably be given insulin after surgery (instead of oral diabetes medicines) to make sure you have good blood sugar levels. The goal for blood sugar control after surgery is 80-180 mg/dL.     The Morning of Surgery Do not wear jewelry, make-up or nail polish. Do not wear lotions, powders, or perfumes/colognes, or deodorant Do not bring valuables to the hospital.  [facility] is not responsible for any belongings or valuables.  If you are a smoker, DO NOT Smoke 24 hours prior to surgery  If you wear a CPAP at night please bring your mask the morning of surgery   Remember that you must have someone to transport you home after your surgery, and remain with you for 24 hours if you are discharged the same day.  Please bring cases for contacts, glasses, hearing aids, dentures or  bridgework because it cannot be worn into surgery.   Patients discharged the day of surgery will not be allowed to drive home.   Please shower the NIGHT BEFORE/MORNING OF SURGERY (use antibacterial soap like DIAL soap if possible). Wear comfortable clothes the morning of surgery. Oral Hygiene is also important to reduce your risk of infection.  Remember - BRUSH YOUR TEETH THE MORNING OF SURGERY WITH YOUR REGULAR TOOTHPASTE  Patient denies shortness of breath, fever, cough and chest pain.

## 2021-07-06 ENCOUNTER — Ambulatory Visit (HOSPITAL_COMMUNITY): Payer: Medicare Other | Admitting: Anesthesiology

## 2021-07-06 ENCOUNTER — Encounter (HOSPITAL_COMMUNITY)
Admission: RE | Disposition: A | Discharge status: Home / Self Care | Payer: Self-pay | Source: Ambulatory Visit | Attending: Vascular Surgery

## 2021-07-06 ENCOUNTER — Ambulatory Visit (HOSPITAL_COMMUNITY)
Admission: RE | Admit: 2021-07-06 | Discharge: 2021-07-06 | Disposition: A | Discharge status: Home / Self Care | Payer: Medicare Other | Source: Ambulatory Visit | Attending: Vascular Surgery | Admitting: Vascular Surgery

## 2021-07-06 ENCOUNTER — Encounter (HOSPITAL_COMMUNITY): Payer: Self-pay | Admitting: Vascular Surgery

## 2021-07-06 DIAGNOSIS — N184 Chronic kidney disease, stage 4 (severe): Secondary | ICD-10-CM | POA: Diagnosis not present

## 2021-07-06 DIAGNOSIS — F32A Depression, unspecified: Secondary | ICD-10-CM | POA: Insufficient documentation

## 2021-07-06 DIAGNOSIS — Z6834 Body mass index (BMI) 34.0-34.9, adult: Secondary | ICD-10-CM | POA: Insufficient documentation

## 2021-07-06 DIAGNOSIS — I69911 Memory deficit following unspecified cerebrovascular disease: Secondary | ICD-10-CM | POA: Diagnosis not present

## 2021-07-06 DIAGNOSIS — E1122 Type 2 diabetes mellitus with diabetic chronic kidney disease: Secondary | ICD-10-CM | POA: Diagnosis not present

## 2021-07-06 DIAGNOSIS — Z794 Long term (current) use of insulin: Secondary | ICD-10-CM | POA: Insufficient documentation

## 2021-07-06 DIAGNOSIS — I5032 Chronic diastolic (congestive) heart failure: Secondary | ICD-10-CM | POA: Insufficient documentation

## 2021-07-06 DIAGNOSIS — G709 Myoneural disorder, unspecified: Secondary | ICD-10-CM | POA: Diagnosis not present

## 2021-07-06 DIAGNOSIS — R569 Unspecified convulsions: Secondary | ICD-10-CM | POA: Diagnosis not present

## 2021-07-06 DIAGNOSIS — G4730 Sleep apnea, unspecified: Secondary | ICD-10-CM | POA: Insufficient documentation

## 2021-07-06 DIAGNOSIS — Z7902 Long term (current) use of antithrombotics/antiplatelets: Secondary | ICD-10-CM | POA: Diagnosis not present

## 2021-07-06 DIAGNOSIS — N186 End stage renal disease: Secondary | ICD-10-CM | POA: Diagnosis not present

## 2021-07-06 DIAGNOSIS — E669 Obesity, unspecified: Secondary | ICD-10-CM | POA: Insufficient documentation

## 2021-07-06 DIAGNOSIS — M797 Fibromyalgia: Secondary | ICD-10-CM | POA: Insufficient documentation

## 2021-07-06 DIAGNOSIS — I132 Hypertensive heart and chronic kidney disease with heart failure and with stage 5 chronic kidney disease, or end stage renal disease: Secondary | ICD-10-CM | POA: Diagnosis not present

## 2021-07-06 DIAGNOSIS — Z452 Encounter for adjustment and management of vascular access device: Secondary | ICD-10-CM | POA: Diagnosis present

## 2021-07-06 HISTORY — DX: Cerebral infarction, unspecified: I63.9

## 2021-07-06 HISTORY — PX: AV FISTULA PLACEMENT: SHX1204

## 2021-07-06 LAB — POCT I-STAT, CHEM 8
BUN: 116 mg/dL — ABNORMAL HIGH (ref 8–23)
Calcium, Ion: 1.17 mmol/L (ref 1.15–1.40)
Chloride: 110 mmol/L (ref 98–111)
Creatinine, Ser: 5.7 mg/dL — ABNORMAL HIGH (ref 0.44–1.00)
Glucose, Bld: 214 mg/dL — ABNORMAL HIGH (ref 70–99)
HCT: 35 % — ABNORMAL LOW (ref 36.0–46.0)
Hemoglobin: 11.9 g/dL — ABNORMAL LOW (ref 12.0–15.0)
Potassium: 4.4 mmol/L (ref 3.5–5.1)
Sodium: 138 mmol/L (ref 135–145)
TCO2: 17 mmol/L — ABNORMAL LOW (ref 22–32)

## 2021-07-06 LAB — GLUCOSE, CAPILLARY
Glucose-Capillary: 203 mg/dL — ABNORMAL HIGH (ref 70–99)
Glucose-Capillary: 225 mg/dL — ABNORMAL HIGH (ref 70–99)
Glucose-Capillary: 196 mg/dL — ABNORMAL HIGH (ref 70–99)

## 2021-07-06 SURGERY — ARTERIOVENOUS (AV) FISTULA CREATION
Anesthesia: Regional | Laterality: Left

## 2021-07-06 MED ORDER — FENTANYL CITRATE (PF) 250 MCG/5ML IJ SOLN
INTRAMUSCULAR | Status: DC | PRN
Start: 2021-07-06 — End: 2021-07-06
  Administered 2021-07-06: 50 ug via INTRAVENOUS

## 2021-07-06 MED ORDER — ACETAMINOPHEN 500 MG PO TABS
1000.0000 mg | ORAL_TABLET | Freq: Once | ORAL | Status: AC
  Administered 2021-07-06: 1000 mg via ORAL
  Filled 2021-07-06: qty 2

## 2021-07-06 MED ORDER — HEPARIN 6000 UNIT IRRIGATION SOLUTION
Status: DC | PRN
  Administered 2021-07-06: 1

## 2021-07-06 MED ORDER — EPHEDRINE SULFATE-NACL 50-0.9 MG/10ML-% IV SOSY
PREFILLED_SYRINGE | INTRAVENOUS | Status: DC | PRN
  Administered 2021-07-06: 5 mg via INTRAVENOUS

## 2021-07-06 MED ORDER — CHLORHEXIDINE GLUCONATE 0.12 % MT SOLN: 15.0000 mL | OROMUCOSAL | Status: AC

## 2021-07-06 MED ORDER — SODIUM CHLORIDE 0.9 % IV SOLN: INTRAVENOUS | Status: DC

## 2021-07-06 MED ORDER — INSULIN ASPART 100 UNIT/ML IJ SOLN
INTRAMUSCULAR | Status: AC
  Filled 2021-07-06: qty 1

## 2021-07-06 MED ORDER — ONDANSETRON HCL 4 MG/2ML IJ SOLN
INTRAMUSCULAR | Status: DC | PRN
  Administered 2021-07-06: 4 mg via INTRAVENOUS

## 2021-07-06 MED ORDER — HEPARIN SODIUM (PORCINE) 1000 UNIT/ML IJ SOLN
INTRAMUSCULAR | Status: DC | PRN
  Administered 2021-07-06: 2000 [IU] via INTRAVENOUS

## 2021-07-06 MED ORDER — OXYCODONE-ACETAMINOPHEN 5-325 MG PO TABS
1.0000 | ORAL_TABLET | Freq: Four times a day (QID) | ORAL | 0 refills | Status: DC | PRN
Start: 2021-07-06 — End: 2021-09-16

## 2021-07-06 MED ORDER — LIDOCAINE 2% (20 MG/ML) 5 ML SYRINGE
INTRAMUSCULAR | Status: AC
  Filled 2021-07-06: qty 5

## 2021-07-06 MED ORDER — PROPOFOL 10 MG/ML IV BOLUS
INTRAVENOUS | Status: AC
  Filled 2021-07-06: qty 20

## 2021-07-06 MED ORDER — PROPOFOL 500 MG/50ML IV EMUL
INTRAVENOUS | Status: DC | PRN
  Administered 2021-07-06: 100 ug/kg/min via INTRAVENOUS

## 2021-07-06 MED ORDER — CHLORHEXIDINE GLUCONATE 4 % EX LIQD: 60.0000 mL | Freq: Once | CUTANEOUS | Status: DC

## 2021-07-06 MED ORDER — FENTANYL CITRATE (PF) 250 MCG/5ML IJ SOLN
INTRAMUSCULAR | Status: AC
  Filled 2021-07-06: qty 5

## 2021-07-06 MED ORDER — LIDOCAINE 2% (20 MG/ML) 5 ML SYRINGE
INTRAMUSCULAR | Status: DC | PRN
  Administered 2021-07-06: 60 mg via INTRAVENOUS

## 2021-07-06 MED ORDER — ONDANSETRON HCL 4 MG/2ML IJ SOLN
INTRAMUSCULAR | Status: AC
  Filled 2021-07-06: qty 2

## 2021-07-06 MED ORDER — VANCOMYCIN HCL IN DEXTROSE 1-5 GM/200ML-% IV SOLN
1000.0000 mg | INTRAVENOUS | Status: AC
  Administered 2021-07-06: 1000 mg via INTRAVENOUS
  Filled 2021-07-06: qty 200

## 2021-07-06 MED ORDER — LIDOCAINE HCL (PF) 1 % IJ SOLN
INTRAMUSCULAR | Status: AC
  Filled 2021-07-06: qty 30

## 2021-07-06 MED ORDER — MEPIVACAINE HCL (PF) 2 % IJ SOLN
INTRAMUSCULAR | Status: DC | PRN
  Administered 2021-07-06: 20 mL

## 2021-07-06 MED ORDER — ONDANSETRON HCL 4 MG/2ML IJ SOLN: 4.0000 mg | Freq: Once | INTRAMUSCULAR | Status: DC | PRN

## 2021-07-06 MED ORDER — HEPARIN 6000 UNIT IRRIGATION SOLUTION
Status: AC
  Filled 2021-07-06: qty 500

## 2021-07-06 MED ORDER — 0.9 % SODIUM CHLORIDE (POUR BTL) OPTIME
TOPICAL | Status: DC | PRN
  Administered 2021-07-06: 1000 mL

## 2021-07-06 MED ORDER — MIDAZOLAM HCL 2 MG/2ML IJ SOLN
INTRAMUSCULAR | Status: AC
  Filled 2021-07-06: qty 2

## 2021-07-06 MED ORDER — FENTANYL CITRATE (PF) 100 MCG/2ML IJ SOLN: 25.0000 ug | INTRAMUSCULAR | Status: DC | PRN

## 2021-07-06 MED ORDER — CHLORHEXIDINE GLUCONATE 0.12 % MT SOLN
OROMUCOSAL | Status: AC
  Administered 2021-07-06: 15 mL via OROMUCOSAL
  Filled 2021-07-06: qty 15

## 2021-07-06 MED ORDER — INSULIN ASPART 100 UNIT/ML IJ SOLN
5.0000 [IU] | Freq: Once | INTRAMUSCULAR | Status: AC
  Administered 2021-07-06: 5 [IU] via SUBCUTANEOUS

## 2021-07-06 MED ORDER — DEXAMETHASONE SODIUM PHOSPHATE 10 MG/ML IJ SOLN
INTRAMUSCULAR | Status: AC
  Filled 2021-07-06: qty 1

## 2021-07-06 MED ORDER — PROPOFOL 1000 MG/100ML IV EMUL
INTRAVENOUS | Status: AC
  Filled 2021-07-06: qty 100

## 2021-07-06 MED ORDER — PHENYLEPHRINE HCL-NACL 20-0.9 MG/250ML-% IV SOLN
INTRAVENOUS | Status: DC | PRN
  Administered 2021-07-06: 25 ug/min via INTRAVENOUS

## 2021-07-06 MED ORDER — OXYCODONE HCL 5 MG/5ML PO SOLN: 5.0000 mg | Freq: Once | ORAL | Status: DC | PRN

## 2021-07-06 MED ORDER — OXYCODONE HCL 5 MG PO TABS: 5.0000 mg | ORAL_TABLET | Freq: Once | ORAL | Status: DC | PRN

## 2021-07-06 MED ORDER — MIDAZOLAM HCL 2 MG/2ML IJ SOLN
INTRAMUSCULAR | Status: DC | PRN
  Administered 2021-07-06 (×2): 1 mg via INTRAVENOUS

## 2021-07-06 SURGICAL SUPPLY — 39 items
ARMBAND PINK RESTRICT EXTREMIT (MISCELLANEOUS) ×2 IMPLANT
BAG COUNTER SPONGE SURGICOUNT (BAG) ×2 IMPLANT
BLADE CLIPPER SURG (BLADE) ×1 IMPLANT
BNDG ELASTIC 4X5.8 VLCR STR LF (GAUZE/BANDAGES/DRESSINGS) ×1 IMPLANT
CANISTER SUCT 3000ML PPV (MISCELLANEOUS) ×2 IMPLANT
CLIP LIGATING EXTRA MED SLVR (CLIP) ×2 IMPLANT
CLIP LIGATING EXTRA SM BLUE (MISCELLANEOUS) ×2 IMPLANT
CLIP VESOCCLUDE MED 6/CT (CLIP) ×2 IMPLANT
COVER PROBE W GEL 5X96 (DRAPES) ×2 IMPLANT
DECANTER SPIKE VIAL GLASS SM (MISCELLANEOUS) ×2 IMPLANT
DERMABOND ADVANCED (GAUZE/BANDAGES/DRESSINGS) ×1
DERMABOND ADVANCED .7 DNX12 (GAUZE/BANDAGES/DRESSINGS) ×1 IMPLANT
ELECT REM PT RETURN 9FT ADLT (ELECTROSURGICAL) ×2
ELECTRODE REM PT RTRN 9FT ADLT (ELECTROSURGICAL) ×1 IMPLANT
GAUZE 4X4 16PLY ~~LOC~~+RFID DBL (SPONGE) ×1 IMPLANT
GLOVE SRG 8 PF TXTR STRL LF DI (GLOVE) ×2 IMPLANT
GLOVE SURG POLYISO LF SZ8 (GLOVE) IMPLANT
GLOVE SURG UNDER POLY LF SZ8 (GLOVE) ×4
GOWN STRL REUS W/ TWL LRG LVL3 (GOWN DISPOSABLE) ×2 IMPLANT
GOWN STRL REUS W/TWL 2XL LVL3 (GOWN DISPOSABLE) ×2 IMPLANT
GOWN STRL REUS W/TWL LRG LVL3 (GOWN DISPOSABLE) ×4
KIT BASIN OR (CUSTOM PROCEDURE TRAY) ×2 IMPLANT
KIT TURNOVER KIT B (KITS) ×2 IMPLANT
NS IRRIG 1000ML POUR BTL (IV SOLUTION) ×2 IMPLANT
PACK CV ACCESS (CUSTOM PROCEDURE TRAY) ×2 IMPLANT
PAD ARMBOARD 7.5X6 YLW CONV (MISCELLANEOUS) ×4 IMPLANT
SPONGE T-LAP 18X18 ~~LOC~~+RFID (SPONGE) ×1 IMPLANT
SUT MNCRL AB 4-0 PS2 18 (SUTURE) ×2 IMPLANT
SUT PROLENE 6 0 BV (SUTURE) ×2 IMPLANT
SUT PROLENE 7 0 BV 1 (SUTURE) ×1 IMPLANT
SUT SILK 2 0 SH (SUTURE) ×2 IMPLANT
SUT SILK 3 0 (SUTURE) ×2
SUT SILK 3 0 SH CR/8 (SUTURE) ×2 IMPLANT
SUT SILK 3-0 FS1 18XBRD (SUTURE) IMPLANT
SUT VIC AB 3-0 SH 27 (SUTURE) ×2
SUT VIC AB 3-0 SH 27X BRD (SUTURE) ×1 IMPLANT
TOWEL GREEN STERILE (TOWEL DISPOSABLE) ×2 IMPLANT
UNDERPAD 30X36 HEAVY ABSORB (UNDERPADS AND DIAPERS) ×2 IMPLANT
WATER STERILE IRR 1000ML POUR (IV SOLUTION) ×2 IMPLANT

## 2021-07-06 NOTE — Op Note (Signed)
° ° °  NAME: [name]    MRN: [id number] DOB: September 22, 1950    DATE OF OPERATION: 07/06/2021  PREOP DIAGNOSIS:    Chronic kidney disease stage IV  POSTOP DIAGNOSIS:    Same  PROCEDURE:    Left-sided, first stage, brachiobasilic fistula  SURGEON: Broadus John  ASSIST: none  ANESTHESIA: Moderate anesthesia, block  EBL: 5 mL  INDICATIONS:    Dorothy Harmon is a 70 y.o. female with stage IV chronic kidney disease who presents the need for long-term HD access.  Bilateral upper extremity vein mapping demonstrated suitable basilic vein for brachiobasilic fistula.  The patient is right-handed, we discussed left-sided brachiobasilic fistula.  I discussed the risks and benefits with [name], and she is aware that brachiobasilic fistula will require another operation for superficialization.  After discussing these risks and benefits, [name] elected to proceed  FINDINGS:   5 mm left basilic vein, 6 mm brachial artery  TECHNIQUE:   A left upper extremity block was performed by the anesthesia team.   The patient was brought to the operating room and placed in supine position. The left arm was prepped and draped in a standard fashion. IV antibiotics were prior to incision. A timeout was performed.   The basilic vein in the left arm was identified using ultrasound and appeared of sufficient size. A transverse incision was made at the elbow creese in the antecubital fossa. The basilic vein was identified and isolated for 4 cm in length.  The bicipital aponeurosis was partially released and the brachial artery freed from its paired brachial veins and secured with a vessel loop. The patient was heparinized. The basilic vein was marked and ligated distally with 2-0 silk, then flushed with heparinized saline. Vascular clamps were placed proximally and distally on the brachial artery and a 5 mm arteriotomy  was created on the brachial artery. This was flushed with heparin saline. The vein was  juxtaposed to the artery and an anastomosis was created using 6-0 Prolene.   Prior to completing the anastomsis, the vessels were flushed and the suture line was tied down. There was an excellent thrill in the basilic vein distal to the anastomosis into the upper arm. Two basilic vein branches were suture-ligated. The patient had a 2+ radial pulse. The incision was irrigated and hemostasis acheived. The deeper tissue was closed with 3-0 Vicryl and the skin closed with 4-0 Monocryl.    Dermabond was applied the incisions. He was transferred to PACU in stable condition.    [name], MD [facility]  DATE OF DICTATION:   07/06/2021

## 2021-07-06 NOTE — H&P (Signed)
Office Note  Patient seen and examined in preop holding.  No complaints. No changes to medication history or physical exam since last seen in clinic. After discussing the risks and benefits of first stage left brachiobasilic fistula, Dorothy Harmon elected to proceed.   Broadus John MD    CC:  ESRD Requesting Provider:  No ref. provider found  HPI: Dorothy Harmon is a Right handed 71 y.o. (07/30/1950) female with kidney disease who presents at the request of No ref. provider found for permanent HD access. The patient has had no prior access procedures. No lines, no dialysis.  Patient is a native of [place] and works for [employer] in Therapist, art. She has 2 children, both not married, who still live in the area.  On exam, patient was doing well.  She had no complaints.  She is aware her kidneys are slowly failing.  We discussed hemodialysis as well as fistula creation.  The pt is  on a statin for cholesterol management.  The pt is not on a daily aspirin.   Other AC:  Plavix The pt is on medications for hypertension.   The pt is not diabetic. Tobacco hx:  -  Past Medical History:  Diagnosis Date   Anticoagulant long-term use    plavix--- managed by pcp, dr Osborne Casco   Chronic diastolic CHF (congestive heart failure) ([place])    last echo in epic 03-04-2021  ef 55%, G1DD, mild TR   CKD (chronic kidney disease), stage IV [facility])    nephrologist-- dr Johnney Ou   Fibromyalgia    History of CVA (cerebrovascular accident) 04/21/2013   hostpiral admission -- acute left thalamus infarct and imaging showed previous old small vessel infart right cerebullum   History of kidney stones    History of seizure 12/2006   h/o 1 seizure    Hypercholesteremia    Hypertension    Hypertensive retinopathy of both eyes    IBS (irritable bowel syndrome)    MDD (major depressive disorder)    OA (osteoarthritis)    OSA (obstructive sleep apnea)    per pt had sleep many yrs ago, intolerant to  cpap   Peripheral neuropathy    Stroke ([place])    Thickened endometrium    Type 2 diabetes mellitus treated with insulin ([place])    followed by pcp---  (03-19-2021 per pt checks daily in am,  fasting sugar-- 90s)    Past Surgical History:  Procedure Laterality Date   CARDIAC CATHETERIZATION  12/01/2003   @[facility]  by dr [place]ique;  normal coronaries, normal lvf, ef 65%, mild to moderate pulm htn (cath done for positive cardiolite)   LAPAROSCOPIC CHOLECYSTECTOMY  04/18/2002   @[facility]    RHINOPLASTY  1974   TUBAL LIGATION Bilateral    yrs ago   WRIST FRACTURE SURGERY  1992    Social History   Socioeconomic History   Marital status: Divorced    Spouse name: Not on file   Number of children: 2   Years of education: 12   Highest education level: Not on file  Occupational History   Occupation: customer service    Comment: [employer]  Tobacco Use   Smoking status: Never   Smokeless tobacco: Never  Vaping Use   Vaping Use: Never used  Substance and Sexual Activity   Alcohol use: No   Drug use: No   Sexual activity: Not on file  Other Topics Concern   Not on file  Social History Narrative   Mother  lives with her; divorced   Retired    Education   Right handed.   Caffeine- None    Social Determinants of Health   Financial Resource Strain: Not on file  Food Insecurity: Not on file  Transportation Needs: Not on file  Physical Activity: Not on file  Stress: Not on file  Social Connections: Not on file  Intimate Partner Violence: Not on file    Family History  Problem Relation Age of Onset   Heart attack Father    Diabetes Mother        alive, 1yo (2014)   Hypertension Mother     Current Facility-Administered Medications  Medication Dose Route Frequency Provider Last Rate Last Admin   0.9 %  sodium chloride infusion   Intravenous Continuous Broadus John, MD       0.9 %  sodium chloride infusion   Intravenous Continuous Audry Pili, MD 10 mL/hr at 07/06/21 0637 New Bag  at 07/06/21 0637   chlorhexidine (HIBICLENS) 4 % liquid 4 application  60 mL Topical Once Broadus John, MD       And   [START ON 07/07/2021] chlorhexidine (HIBICLENS) 4 % liquid 4 application  60 mL Topical Once Broadus John, MD       vancomycin (VANCOCIN) IVPB 1000 mg/200 mL premix  1,000 mg Intravenous 60 min Pre-Op Broadus John, MD 200 mL/hr at 07/06/21 0636 1,000 mg at 07/06/21 0636   Facility-Administered Medications Ordered in Other Encounters  Medication Dose Route Frequency Provider Last Rate Last Admin   fentaNYL citrate (PF) (SUBLIMAZE) injection   Intravenous Anesthesia Intra-op Michele Rockers, CRNA   50 mcg at 07/06/21 0710   midazolam (VERSED) injection   Intravenous Anesthesia Intra-op Michele Rockers, CRNA   1 mg at 07/06/21 0710    Allergies  Allergen Reactions   Ace Inhibitors Other (See Comments)    cough   Codeine Itching and Swelling   Morphine And Related Other (See Comments)    Loss of consciousness, feeling sick, faint   Penicillins Itching and Swelling    Did it involve swelling of the face/tongue/throat, SOB, or low BP? N Did it involve sudden or severe rash/hives, skin peeling, or any reaction on the inside of your mouth or nose? N Did you need to seek medical attention at a hospital or doctor's office? N When did it last happen?   45 years ago    If all above answers are "NO", may proceed with cephalosporin use.      REVIEW OF SYSTEMS:   [X]  denotes positive finding, [ ]  denotes negative finding Cardiac  Comments:  Chest pain or chest pressure:    Shortness of breath upon exertion:    Short of breath when lying flat:    Irregular heart rhythm:        Vascular    Pain in calf, thigh, or hip brought on by ambulation:    Pain in feet at night that wakes you up from your sleep:     Blood clot in your veins:    Leg swelling:         Pulmonary    Oxygen at home:    Productive cough:     Wheezing:         Neurologic    Sudden  weakness in arms or legs:     Sudden numbness in arms or legs:     Sudden onset of difficulty speaking or slurred speech:  Temporary loss of vision in one eye:     Problems with dizziness:         Gastrointestinal    Blood in stool:     Vomited blood:         Genitourinary    Burning when urinating:     Blood in urine:        Psychiatric    Major depression:         Hematologic    Bleeding problems:    Problems with blood clotting too easily:        Skin    Rashes or ulcers:        Constitutional    Fever or chills:      PHYSICAL EXAMINATION:  Vitals:   07/06/21 0602  BP: (!) 174/60  Pulse: 95  Resp: 18  Temp: 98 F (36.7 C)  TempSrc: Oral  SpO2: 98%  Weight: 90.7 kg  Height: 5\' 4"  (1.626 m)    General:  WDWN in NAD; vital signs documented above Gait: Not observed HENT: WNL, normocephalic Pulmonary: normal non-labored breathing , without Rales, rhonchi,  wheezing Cardiac: regular HR Abdomen: soft, NT, no masses Skin: with rashes Vascular Exam/Pulses:  Right Left  Radial 2+ (normal) 2+ (normal)  Ulnar 2+ (normal) 2+ (normal)                   Extremities: without ischemic changes, without Gangrene , without cellulitis; without open wounds;  Musculoskeletal: no muscle wasting or atrophy  Neurologic: A&O X 3;  No focal weakness or paresthesias are detected Psychiatric:  The pt has Normal affect.   Non-Invasive Vascular Imaging:    +-----------------+-------------+----------+---------+   Right Cephalic    Diameter (cm) Depth (cm) Findings    +-----------------+-------------+----------+---------+   Shoulder              0.18         1.81                +-----------------+-------------+----------+---------+   Prox upper arm        0.20         1.58                +-----------------+-------------+----------+---------+   Mid upper arm         0.17         0.81                +-----------------+-------------+----------+---------+   Dist upper arm         0.20         0.54                +-----------------+-------------+----------+---------+   Antecubital fossa     0.28         0.27                +-----------------+-------------+----------+---------+   Prox forearm          0.22         0.70    branching   +-----------------+-------------+----------+---------+   Mid forearm           0.26         0.65                +-----------------+-------------+----------+---------+   Dist forearm          0.18         0.48                +-----------------+-------------+----------+---------+  Wrist                 0.20         0.29                +-----------------+-------------+----------+---------+   +-----------------+-------------+----------+---------+   Right Basilic     Diameter (cm) Depth (cm) Findings    +-----------------+-------------+----------+---------+   Mid upper arm         0.45         1.52                +-----------------+-------------+----------+---------+   Dist upper arm        0.45         1.25    branching   +-----------------+-------------+----------+---------+   Antecubital fossa     0.43         1.04    branching   +-----------------+-------------+----------+---------+   Prox forearm          0.23         0.68                +-----------------+-------------+----------+---------+   Mid forearm           0.19         0.59                +-----------------+-------------+----------+---------+   Distal forearm        0.19         0.39                +-----------------+-------------+----------+---------+   +-----------------+-------------+----------+---------+   Left Cephalic     Diameter (cm) Depth (cm) Findings    +-----------------+-------------+----------+---------+   Shoulder              0.16         0.91                +-----------------+-------------+----------+---------+   Prox upper arm        0.19         0.71                +-----------------+-------------+----------+---------+   Mid upper arm          0.20         0.51                +-----------------+-------------+----------+---------+   Dist upper arm        0.23         0.39    branching   +-----------------+-------------+----------+---------+   Antecubital fossa     0.22         0.51                +-----------------+-------------+----------+---------+   Prox forearm          0.20         0.48                +-----------------+-------------+----------+---------+   Mid forearm           0.19         0.52                +-----------------+-------------+----------+---------+   Dist forearm          0.20         0.49                +-----------------+-------------+----------+---------+   Wrist  0.16         0.48                +-----------------+-------------+----------+---------+   +-----------------+-------------+----------+---------+   Left Basilic      Diameter (cm) Depth (cm) Findings    +-----------------+-------------+----------+---------+   Prox upper arm        0.37         1.60                +-----------------+-------------+----------+---------+   Mid upper arm         0.33         1.00                +-----------------+-------------+----------+---------+   Dist upper arm        0.26         0.84    branching   +-----------------+-------------+----------+---------+   Antecubital fossa     0.32         0.25                +-----------------+-------------+----------+---------+   Prox forearm          0.15         0.47    branching   +-----------------+-------------+----------+---------+   Mid forearm           0.22         0.90                +-----------------+-------------+----------+---------+   Distal forearm        0.16         0.92                +-----------------+-------------+----------+---------+   Wrist                 0.18         0.71    branching   +-----------------+-------------+----------+---------+     ASSESSMENT/PLAN:  Dorothy Harmon is a 71 y.o. female who presents with chronic  kidney disease.  Based on vein mapping and examination, the vein is a candidate for brachiobasilic fistula creation in the left or right upper extremity. Being that Dorothy Harmon is right-handed, we discussed left-sided brachiobasilic fistula creation I had an extensive discussion with this patient in regards to the nature of access surgery, including risk, benefits, and alternatives.   The patient is aware that the risks of access surgery include but are not limited to: bleeding, infection, steal syndrome, nerve damage, ischemic monomelic neuropathy, failure of access to mature, complications related to venous hypertension, and possible need for additional access procedures in the future. I discussed with the patient the nature of the staged access procedure, specifically the need for a second operation to transpose the first stage fistula if it matures adequately.   The patient has agreed to proceed with the above procedure which will be scheduled at her convenience in the coming weeks.Broadus John, MD [facility] [phone]

## 2021-07-06 NOTE — Anesthesia Procedure Notes (Signed)
Procedure Name: MAC Date/Time: 07/06/2021 7:30 AM Performed by: Audry Pili, MD Pre-anesthesia Checklist: Patient identified, Emergency Drugs available, Suction available, Patient being monitored and Timeout performed Patient Re-evaluated:Patient Re-evaluated prior to induction Oxygen Delivery Method: Simple face mask

## 2021-07-06 NOTE — Anesthesia Procedure Notes (Signed)
Anesthesia Regional Block: Supraclavicular block   Pre-Anesthetic Checklist: , timeout performed,  Correct Patient, Correct Site, Correct Laterality,  Correct Procedure, Correct Position, site marked,  Risks and benefits discussed,  Surgical consent,  Pre-op evaluation,  At surgeon's request and post-op pain management  Laterality: Left  Prep: chloraprep       Needles:  Injection technique: Single-shot  Needle Type: Echogenic Needle     Needle Length: 5cm  Needle Gauge: 21     Additional Needles:   Narrative:  Start time: 07/06/2021 7:10 AM End time: 07/06/2021 7:14 AM Injection made incrementally with aspirations every 5 mL.  Performed by: Personally  Anesthesiologist: Audry Pili, MD  Additional Notes: No pain on injection. No increased resistance to injection. Injection made in 5cc increments. Good needle visualization. Patient tolerated the procedure well.

## 2021-07-06 NOTE — Transfer of Care (Signed)
Immediate Anesthesia Transfer of Care Note  Patient: WILLER OSORNO  Procedure(s) Performed: LEFT FIRST STAGE BRACHIOBASILIC FISTULA CREATION (Left)  Patient Location: PACU  Anesthesia Type:MAC and Regional  Level of Consciousness: awake and alert   Airway & Oxygen Therapy: Patient Spontanous Breathing  Post-op Assessment: Report given to RN and Post -op Vital signs reviewed and stable  Post vital signs: Reviewed and stable  Last Vitals:  Vitals Value Taken Time  BP    Temp    Pulse 89 07/06/21 0847  Resp 13 07/06/21 0847  SpO2 98 % 07/06/21 0847  Vitals shown include unvalidated device data.  Last Pain:  Vitals:   07/06/21 0616  TempSrc:   PainSc: 0-No pain      Patients Stated Pain Goal: 0 (35/00/93 8182)  Complications: No notable events documented.

## 2021-07-06 NOTE — Anesthesia Postprocedure Evaluation (Signed)
Anesthesia Post Note  Patient: Dorothy Harmon  Procedure(s) Performed: LEFT FIRST STAGE BRACHIOBASILIC FISTULA CREATION (Left)     Patient location during evaluation: PACU Anesthesia Type: Regional Level of consciousness: awake and alert Pain management: pain level controlled Vital Signs Assessment: post-procedure vital signs reviewed and stable Respiratory status: spontaneous breathing, nonlabored ventilation and respiratory function stable Cardiovascular status: stable and blood pressure returned to baseline Anesthetic complications: no   No notable events documented.  Last Vitals:  Vitals:   07/06/21 0935 07/06/21 0950  BP: (!) 133/59 (!) 131/58  Pulse: 89 85  Resp: 17 15  Temp:  (!) 36.2 C  SpO2: 98% 100%    Last Pain:  Vitals:   07/06/21 0950  TempSrc:   PainSc: 0-No pain                 Audry Pili

## 2021-07-06 NOTE — Discharge Instructions (Addendum)
[facility]  Discharge InstructionLeft A  AV Fistula or Graft Surgery for Dialysis Access  Please refer to the following instructions for your post-procedure care. Your surgeon or physician assistant will discuss any changes with you.  Activity  You may drive the day following your surgery, if you are comfortable and no longer taking prescription pain medication. Resume full activity as the soreness in your incision resolves.  Bathing/Showering  You may shower after you go home. Keep your incision dry for 48 hours. Do not soak in a bathtub, hot tub, or swim until the incision heals completely. You may not shower if you have a hemodialysis catheter.  Incision Care  Clean your incision with mild soap and water after 48 hours. Pat the area dry with a clean towel. You do not need a bandage unless otherwise instructed. Do not apply any ointments or creams to your incision. You may have skin glue on your incision. Do not peel it off. It will come off on its own in about one week. Your arm may swell a bit after surgery. To reduce swelling use pillows to elevate your arm so it is above your heart. Your doctor will tell you if you need to lightly wrap your arm with an ACE bandage.  Diet  Resume your normal diet. There are not special food restrictions following this procedure. In order to heal from your surgery, it is CRITICAL to get adequate nutrition. Your body requires vitamins, minerals, and protein. Vegetables are the best source of vitamins and minerals. Vegetables also provide the perfect balance of protein. Processed food has little nutritional value, so try to avoid this.  Medications  Resume taking all of your medications. If your incision is causing pain, you may take over-the counter pain relievers such as acetaminophen (Tylenol). If you were prescribed a stronger pain medication, please be aware these medications can cause nausea and constipation.  Prevent nausea by taking the medication with a snack or meal. Avoid constipation by drinking plenty of fluids and eating foods with high amount of fiber, such as fruits, vegetables, and grains.  Do not take Tylenol if you are taking prescription pain medications.  Follow up Your surgeon may want to see you in the office following your access surgery. If so, this will be arranged at the time of your surgery.  Please call us immediately for any of the following conditions:  Increased pain, redness, drainage (pus) from your incision site Fever of 101 degrees or higher Severe or worsening pain at your incision site Hand pain or numbness.  Reduce your risk of vascular disease:  Stop smoking. If you would like help, call [facility] at 1-800-QUIT-NOW 734-247-0037) or [place] at [place] your cholesterol Maintain a desired weight Control your diabetes Keep your blood pressure down  Dialysis  It will take several weeks to several months for your new dialysis access to be ready for use. Your surgeon will determine when it is okay to use it. Your nephrologist will continue to direct your dialysis. You can continue to use your Permcath until your new access is ready for use.   07/06/2021 ESRA FRANKOWSKI [id number] 1951/04/26  Surgeon(s): Broadus John, MD  Procedure(s): LEFT FIRST STAGE BRACHIOBASILIC FISTULA CREATION   May stick graft immediately   May stick graft on designated area only:   X Do not stick Left AV fistula for 12 weeks    If you have any questions, please call the  office at [phone].

## 2021-07-07 ENCOUNTER — Encounter (HOSPITAL_COMMUNITY): Payer: Self-pay | Admitting: Vascular Surgery

## 2021-08-10 ENCOUNTER — Other Ambulatory Visit: Payer: Self-pay

## 2021-08-10 ENCOUNTER — Emergency Department (HOSPITAL_COMMUNITY): Payer: Medicare Other

## 2021-08-10 ENCOUNTER — Emergency Department (HOSPITAL_COMMUNITY)
Admission: EM | Admit: 2021-08-10 | Discharge: 2021-08-10 | Disposition: A | Discharge status: Home / Self Care | Payer: Medicare Other | Attending: Emergency Medicine | Admitting: Emergency Medicine

## 2021-08-10 ENCOUNTER — Telehealth: Payer: Self-pay | Admitting: *Deleted

## 2021-08-10 DIAGNOSIS — N186 End stage renal disease: Secondary | ICD-10-CM | POA: Insufficient documentation

## 2021-08-10 DIAGNOSIS — E1122 Type 2 diabetes mellitus with diabetic chronic kidney disease: Secondary | ICD-10-CM | POA: Diagnosis not present

## 2021-08-10 DIAGNOSIS — I132 Hypertensive heart and chronic kidney disease with heart failure and with stage 5 chronic kidney disease, or end stage renal disease: Secondary | ICD-10-CM | POA: Insufficient documentation

## 2021-08-10 DIAGNOSIS — I951 Orthostatic hypotension: Secondary | ICD-10-CM | POA: Diagnosis not present

## 2021-08-10 DIAGNOSIS — Z992 Dependence on renal dialysis: Secondary | ICD-10-CM | POA: Insufficient documentation

## 2021-08-10 DIAGNOSIS — R109 Unspecified abdominal pain: Secondary | ICD-10-CM | POA: Insufficient documentation

## 2021-08-10 DIAGNOSIS — Z7902 Long term (current) use of antithrombotics/antiplatelets: Secondary | ICD-10-CM | POA: Diagnosis not present

## 2021-08-10 DIAGNOSIS — Z794 Long term (current) use of insulin: Secondary | ICD-10-CM | POA: Insufficient documentation

## 2021-08-10 DIAGNOSIS — N939 Abnormal uterine and vaginal bleeding, unspecified: Secondary | ICD-10-CM

## 2021-08-10 DIAGNOSIS — I509 Heart failure, unspecified: Secondary | ICD-10-CM | POA: Insufficient documentation

## 2021-08-10 LAB — URINALYSIS, ROUTINE W REFLEX MICROSCOPIC
Bilirubin Urine: NEGATIVE
Glucose, UA: NEGATIVE mg/dL
Ketones, ur: 5 mg/dL — AB
Nitrite: NEGATIVE
Protein, ur: >=300 mg/dL — AB
RBC / HPF: >50 RBC/hpf — ABNORMAL HIGH (ref 0–5)
Specific Gravity, Urine: 1.015 (ref 1.005–1.030)
pH: 6 (ref 5.0–8.0)

## 2021-08-10 LAB — CBC WITH DIFFERENTIAL/PLATELET
Abs Immature Granulocytes: 0.03 10*3/uL (ref 0.00–0.07)
Basophils Absolute: 0 10*3/uL (ref 0.0–0.1)
Basophils Relative: 1 %
Eosinophils Absolute: 0 10*3/uL (ref 0.0–0.5)
Eosinophils Relative: 0 %
HCT: 32.4 % — ABNORMAL LOW (ref 36.0–46.0)
Hemoglobin: 10.7 g/dL — ABNORMAL LOW (ref 12.0–15.0)
Immature Granulocytes: 0 %
Lymphocytes Relative: 16 %
Lymphs Abs: 1.1 10*3/uL (ref 0.7–4.0)
MCH: 29.6 pg (ref 26.0–34.0)
MCHC: 33 g/dL (ref 30.0–36.0)
MCV: 89.8 fL (ref 80.0–100.0)
Monocytes Absolute: 0.5 10*3/uL (ref 0.1–1.0)
Monocytes Relative: 7 %
Neutro Abs: 5.1 10*3/uL (ref 1.7–7.7)
Neutrophils Relative %: 76 %
Platelets: 244 10*3/uL (ref 150–400)
RBC: 3.61 MIL/uL — ABNORMAL LOW (ref 3.87–5.11)
RDW: 13.1 % (ref 11.5–15.5)
WBC: 6.7 10*3/uL (ref 4.0–10.5)
nRBC: 0 % (ref 0.0–0.2)

## 2021-08-10 LAB — COMPREHENSIVE METABOLIC PANEL
ALT: 21 U/L (ref 0–44)
AST: 20 U/L (ref 15–41)
Albumin: 3.8 g/dL (ref 3.5–5.0)
Alkaline Phosphatase: 69 U/L (ref 38–126)
Anion gap: 12 (ref 5–15)
BUN: 23 mg/dL (ref 8–23)
CO2: 31 mmol/L (ref 22–32)
Calcium: 8.5 mg/dL — ABNORMAL LOW (ref 8.9–10.3)
Chloride: 96 mmol/L — ABNORMAL LOW (ref 98–111)
Creatinine, Ser: 3.41 mg/dL — ABNORMAL HIGH (ref 0.44–1.00)
GFR, Estimated: 14 mL/min — ABNORMAL LOW (ref >60)
Glucose, Bld: 179 mg/dL — ABNORMAL HIGH (ref 70–99)
Potassium: 2.9 mmol/L — ABNORMAL LOW (ref 3.5–5.1)
Sodium: 139 mmol/L (ref 135–145)
Total Bilirubin: 0.7 mg/dL (ref 0.3–1.2)
Total Protein: 6.6 g/dL (ref 6.5–8.1)

## 2021-08-10 LAB — LIPASE, BLOOD: Lipase: 58 U/L — ABNORMAL HIGH (ref 11–51)

## 2021-08-10 MED ORDER — ACETAMINOPHEN 500 MG PO TABS
1000.0000 mg | ORAL_TABLET | Freq: Once | ORAL | Status: AC
Start: 2021-08-10 — End: 2021-08-10
  Administered 2021-08-10: 1000 mg via ORAL
  Filled 2021-08-10: qty 2

## 2021-08-10 NOTE — Discharge Instructions (Addendum)
Stop taking your amlodipine and irbesartan.  Your blood pressure is dropping very low which is why you are feeling lightheaded and most likely passing out.  Your blood pressure will eventually go back up but when you first start dialysis this is very common.  No evidence of any urinary tract infections today or problems with your kidneys.  It is okay to take 1 or 2 extra strength Tylenol as needed for back discomfort.

## 2021-08-10 NOTE — ED Provider Triage Note (Signed)
Emergency Medicine Provider Triage Evaluation Note  Dorothy Harmon , a 71 y.o. female  was evaluated in triage.  Pt complains of vaginal bleeding.  She states that she has had vaginal bleeding for about 3 weeks now.  It started out very light but is now gotten more heavy.  She says that she has not had a menstrual period for several years.  She claims that she notices the blood when she wipes and in the toilet.  She has associated left flank pain as well.  She has not noticed any dysuria she has some vague pelvic pain.  Denies fevers..  Review of Systems  Positive: Vaginal bleeding, flank pain, pelvic pain Negative:   Physical Exam  BP 131/70 (BP Location: Right Arm)    Pulse (!) 109    Resp (!) 26    SpO2 100%  Gen:   Awake, no distress   Resp:  Normal effort  MSK:   Moves extremities without difficulty  Other:    Medical Decision Making  Medically screening exam initiated at 1:14 PM.  Appropriate orders placed.  ANIYLAH AVANS was informed that the remainder of the evaluation will be completed by another provider, this initial triage assessment does not replace that evaluation, and the importance of remaining in the ED until their evaluation is complete.  Patient will need further evaluation of the back to determine if vaginal bleeding is from hematuria versus true vaginal bleeding.   Adolphus Birchwood, PA-C 08/10/21 1318

## 2021-08-10 NOTE — ED Provider Notes (Signed)
[facility] EMERGENCY DEPARTMENT Provider Note   CSN: [id number] Arrival date & time: 08/10/21  1219     History  Chief Complaint  Patient presents with   Back Pain   Vaginal Bleeding    Dorothy Harmon is a 71 y.o. female.  Patient is a 71 year old female with multiple medical problems including end-stage renal disease who recently started dialysis 3 weeks ago, CHF, hypertension, diabetes who is presenting today with multiple complaints.  Patient reports that since starting dialysis 3 weeks ago she is having recurrent episodes of fainting.  She reports she can be up for more than 5 or 10 minutes when she becomes lightheaded and has to sit down but will often times pass out for a very short time.  She reports she has always been a fainter but this is really affecting her quality of life.  She is not having any chest pain or shortness of breath reports in the last month she has lost 20 pounds which she was unaware of.  She also has noticed over the last 4 weeks she has had gradually worsening vaginal bleeding.  It initially started with some mild spotting and now is present anytime she wipes or goes to the bathroom.  She denies any copious amounts of bleeding.  This is happened in the past and she was actually supposed to have a D&C done with a hysteroscopy but because of issues getting to and from the surgery it was canceled and she was unaware that she needed to follow-up for rescheduling.  She is on Plavix but no other anticoagulation.  She reports since starting dialysis she has continued on her blood pressure medication and there is been no adjustments.  As long as she is lying down she feels okay but with doing anything she has no energy and feels lightheaded.  She takes both amlodipine and Avapro.  She has not had significant cough, fever, dysuria or diarrhea.  The history is provided by the patient and medical records.  Back Pain Vaginal Bleeding Associated symptoms:  back pain       Home Medications Prior to Admission medications   Medication Sig Start Date End Date Taking? Authorizing Provider  amLODipine (NORVASC) 5 MG tablet Take 1 tablet (5 mg total) by mouth daily. Patient taking differently: Take 5 mg by mouth every evening. 08/24/20   Patwardhan, Reynold Bowen, MD  clopidogrel (PLAVIX) 75 MG tablet Take 1 tablet (75 mg total) by mouth daily with breakfast. Patient taking differently: Take 75 mg by mouth every evening. 04/24/13   Effie Berkshire, MD  insulin glargine, 2 Unit Dial, (TOUJEO MAX SOLOSTAR) 300 UNIT/ML Solostar Pen Inject 41 Units into the skin at bedtime.    [provider]  irbesartan (AVAPRO) 300 MG tablet Take 300 mg by mouth every evening.    [provider]  oxyCODONE-acetaminophen (PERCOCET) 5-325 MG tablet Take 1 tablet by mouth every 6 (six) hours as needed for severe pain. 07/06/21 07/06/22  Baglia, Corrina, PA-C  rosuvastatin (CRESTOR) 20 MG tablet Take 1 tablet (20 mg total) by mouth daily. Patient taking differently: Take 20 mg by mouth every evening. 08/24/20   Patwardhan, Reynold Bowen, MD  Vitamin D, Ergocalciferol, (DRISDOL) 1.25 MG (50000 UNIT) CAPS capsule Take 50,000 Units by mouth every 30 (thirty) days. 05/31/21   [provider]      Allergies    Ace inhibitors, Codeine, Morphine and related, and Penicillins    Review of Systems  Review of Systems  Genitourinary:  Positive for vaginal bleeding.  Musculoskeletal:  Positive for back pain.   Physical Exam Updated Vital Signs BP 121/65    Pulse 78    Resp 16    SpO2 100%  Physical Exam Vitals and nursing note reviewed.  Constitutional:      General: She is not in acute distress.    Appearance: Normal appearance. She is well-developed.  HENT:     Head: Normocephalic and atraumatic.  Eyes:     Pupils: Pupils are equal, round, and reactive to light.  Cardiovascular:     Rate and Rhythm: Normal rate and regular rhythm.     Heart sounds: Normal  heart sounds. No murmur heard.   No friction rub.  Pulmonary:     Effort: Pulmonary effort is normal.     Breath sounds: Normal breath sounds. No wheezing or rales.     Comments: Tunneling catheter present in the right chest without significant drainage or erythema around the catheter site Abdominal:     General: Bowel sounds are normal. There is no distension.     Palpations: Abdomen is soft.     Tenderness: There is no abdominal tenderness. There is right CVA tenderness and left CVA tenderness. There is no guarding or rebound.  Genitourinary:    Vagina: Bleeding present.     Cervix: Normal.     Uterus: Normal.      Adnexa: Right adnexa normal and left adnexa normal.  Musculoskeletal:        General: No tenderness. Normal range of motion.     Cervical back: Normal range of motion and neck supple.     Right lower leg: No edema.     Left lower leg: No edema.     Comments: No edema  Skin:    General: Skin is warm and dry.     Findings: No rash.  Neurological:     Mental Status: She is alert and oriented to person, place, and time. Mental status is at baseline.     Cranial Nerves: No cranial nerve deficit.  Psychiatric:        Mood and Affect: Mood normal.        Behavior: Behavior normal.    ED Results / Procedures / Treatments   Labs (all labs ordered are listed, but only abnormal results are displayed) Labs Reviewed  CBC WITH DIFFERENTIAL/PLATELET - Abnormal; Notable for the following components:      Result Value   RBC 3.61 (*)    Hemoglobin 10.7 (*)    HCT 32.4 (*)    All other components within normal limits  COMPREHENSIVE METABOLIC PANEL - Abnormal; Notable for the following components:   Potassium 2.9 (*)    Chloride 96 (*)    Glucose, Bld 179 (*)    Creatinine, Ser 3.41 (*)    Calcium 8.5 (*)    GFR, Estimated 14 (*)    All other components within normal limits  LIPASE, BLOOD - Abnormal; Notable for the following components:   Lipase 58 (*)    All other  components within normal limits  URINALYSIS, ROUTINE W REFLEX MICROSCOPIC - Abnormal; Notable for the following components:   Color, Urine AMBER (*)    APPearance HAZY (*)    Hgb urine dipstick LARGE (*)    Ketones, ur 5 (*)    Protein, ur >=300 (*)    Leukocytes,Ua MODERATE (*)    RBC / HPF >50 (*)    Bacteria,  UA RARE (*)    Non Squamous Epithelial 0-5 (*)    All other components within normal limits    EKG EKG Interpretation  Date/Time:  Tuesday August 10 2021 12:39:38 EST Ventricular Rate:  97 PR Interval:  160 QRS Duration: 90 QT Interval:  386 QTC Calculation: 490 R Axis:   -40 Text Interpretation: Sinus rhythm with Premature supraventricular complexes Left axis deviation Cannot rule out Anterior infarct , age undetermined Abnormal ECG When compared with ECG of 06-Jul-2021 05:58, PREVIOUS ECG IS PRESENT Confirmed by Blanchie Dessert [id number]) on 08/10/2021 1:48:19 PM  Radiology CT ABDOMEN PELVIS WO CONTRAST  Result Date: 08/10/2021 CLINICAL DATA:  Flank pain, kidney stone suspected. Back pain. Vaginal bleeding. EXAM: CT ABDOMEN AND PELVIS WITHOUT CONTRAST TECHNIQUE: Multidetector CT imaging of the abdomen and pelvis was performed following the standard protocol without IV contrast. RADIATION DOSE REDUCTION: This exam was performed according to the departmental dose-optimization program which includes automated exposure control, adjustment of the mA and/or kV according to patient size and/or use of iterative reconstruction technique. COMPARISON:  Pelvic ultrasound 01/07/2021. CT abdomen and pelvis 06/09/2005. FINDINGS: Lower chest: No basilar lung consolidation or pleural effusion. Three-vessel coronary atherosclerosis. Hepatobiliary: No focal liver abnormality is seen. Status post cholecystectomy. No biliary dilatation. Pancreas: Unremarkable. Spleen: Unremarkable. Adrenals/Urinary Tract: Unremarkable adrenal glands. Possible 1 mm calculus in the upper pole of the left kidney  (series 6, image 59) no hydronephrosis. No evidence of a renal mass on this unenhanced study. Unremarkable ladder. Stomach/Bowel: The stomach is unremarkable. There is no evidence of bowel obstruction or inflammation. Mild sigmoid colon diverticulosis is noted without evidence of diverticulitis. The appendix is unremarkable. Vascular/Lymphatic: Abdominal aortic atherosclerosis without aneurysm. No enlarged lymph nodes. Reproductive: 6.0 x 5.2 cm intermediate density right adnexal mass which is new from 2006. This abuts the lateral aspect of the uterine fundus and likely corresponds to the 6.2 cm mass questioned as an exophytic fibroid on the 2022 ultrasound. No left adnexal mass. Mildly bulbous appearance of the upper uterus which may correspond to the endometrial mass shown on the prior ultrasound but is not well characterized by this noncontrast CT. Other: No ascites or pneumoperitoneum. Musculoskeletal: No suspicious osseous lesion. Moderate lumbar disc and facet degeneration. IMPRESSION: 1. No acute abnormality identified in the abdomen or pelvis. 2. Questionable 1 mm nonobstructing left renal calculus. 3. 6 cm right adnexal mass, unchanged in size from a 01/07/2021 pelvic ultrasound. This may reflect an ovarian mass or exophytic uterine fibroid with the former favored. 4. Poor characterization of the endometrial mass demonstrated on prior ultrasound. 5. Aortic Atherosclerosis (ICD10-I70.0). Electronically Signed   By: Logan Bores M.D.   On: 08/10/2021 15:05    Procedures Procedures    Medications Ordered in ED Medications  acetaminophen (TYLENOL) tablet 1,000 mg (1,000 mg Oral Given 08/10/21 1423)    ED Course/ Medical Decision Making/ A&P                           Medical Decision Making Amount and/or Complexity of Data Reviewed External Data Reviewed: notes. Labs: ordered. Decision-making details documented in ED Course. Radiology: ordered and independent interpretation performed.  Decision-making details documented in ED Course. ECG/medicine tests: ordered and independent interpretation performed. Decision-making details documented in ED Course.  Risk OTC drugs.   Patient is a 71 year old female with multiple medical problems presenting today with multiple complaints.  Feel that the majority of the complaints are related to newly starting  dialysis.  Feel that she is most likely having hypotensive episodes after starting dialysis and continuing her current blood pressure medications.  The fainting episode she describes her always when she is standing or trying to do something and occur approximately 10 minutes after she stands up.  Here when checking orthostatics patient's blood pressure drops from 712-458 systolic.  She is denying any chest pain, palpitations or shortness of breath and low suspicion at this time for PE, ACS or acute lung pathology.  Oxygen saturation is 100% on room air and she has normal respiratory rate.  Feel that she will most likely need to discontinue some of her blood pressure medication in this timeframe while her body adjusts to dialysis.  She is also complaining of bilateral flank pain which could be a result of dialysis as well.  She is still making urine but denies significant urinary symptoms.  Will ensure no evidence of acute kidney pathology with CT to rule out any evidence of pyelonephritis and a UA.  She is not displaying signs of sepsis at this time and is otherwise well-appearing.  Finally she is complaining of vaginal bleeding which has been gradually progressive in the last 4 weeks.  She has had a prior history of this and was supposed to get a D&C but it did not work out because of her not having a ride.  She did make an appoint with Dr. Rip Harbour and that is scheduled for March to discuss D&C.  I independently interpreted patient's labs today and her hemoglobin is stable in the high 10 range do not feel that she is losing excessive bleeding  vaginally.  Patient CMP today with hyponatremia with potassium of 2.9 which may be adding into some of her weakness but creatinine is typical for end-stage renal disease today and normal anion gap.  Lipase without acute findings.  Pelvic exam with some bleeding present but no other acute findings and this will need to be followed up by OB/GYN.  3:26 PM I independently viewed and interpreted CT which does show a left renal stone but no evidence of hydronephrosis.  Radiology reported no acute abnormalities.  She does have a 6 cm right adnexal mass which may be ovarian or uterine.  This can be followed up with OB/GYN when she sees them in March.  Findings discussed with the patient.  UA without convincing signs of UTI today.         Final Clinical Impression(s) / ED Diagnoses Final diagnoses:  Abnormal vaginal bleeding  Orthostatic hypotension    Rx / DC Orders ED Discharge Orders     None         Blanchie Dessert, MD 08/10/21 1528

## 2021-08-10 NOTE — ED Notes (Signed)
;  label specimen cup giving to pt. Pt stated new  to dialysis may not produce urine

## 2021-08-10 NOTE — Telephone Encounter (Signed)
TC from patient concerned for recent vaginal bleeding. Reports a recent care provider informed her that she was "supposed to have a hysterectomy" by Dr. Rip Harbour but never had it done. Patient does not recall being told that she needed a hysterectomy.  Chart reviewed. Recommendation was actually for hysteroscopy and D & C in 02/2021. Pre op H & P done, pre op phone call done. Pre op phone call indicated patient may have issue with driver and 24 hour care after surgery and may have to reschedule surgery. Surgery not done and not rescheduled. Patient is confused. Reassurance provided. Patient directed to 1) make and appt to see Dr. Rip Harbour again to see if that is still his recommendation and 2) go to the Emergency Room if she feels her bleeding is excessive or for any emergent concerns.  Patient verbalized understanding. Call transferred to front office staff to schedule f/u Dr. Rip Harbour.

## 2021-08-10 NOTE — ED Triage Notes (Signed)
Pt. Stated, my back hurts and Ive had bleeding for 3 weeks.

## 2021-08-13 ENCOUNTER — Encounter: Payer: Medicare Other | Admitting: Vascular Surgery

## 2021-08-13 ENCOUNTER — Encounter (HOSPITAL_COMMUNITY): Payer: Medicare Other

## 2021-08-17 ENCOUNTER — Other Ambulatory Visit: Payer: Self-pay | Admitting: *Deleted

## 2021-08-17 DIAGNOSIS — N189 Chronic kidney disease, unspecified: Secondary | ICD-10-CM

## 2021-08-27 ENCOUNTER — Ambulatory Visit (HOSPITAL_COMMUNITY)
Admission: RE | Admit: 2021-08-27 | Discharge: 2021-08-27 | Disposition: A | Discharge status: Home / Self Care | Payer: Medicare Other | Source: Ambulatory Visit | Attending: Vascular Surgery | Admitting: Vascular Surgery

## 2021-08-27 ENCOUNTER — Other Ambulatory Visit: Payer: Self-pay

## 2021-08-27 DIAGNOSIS — N189 Chronic kidney disease, unspecified: Secondary | ICD-10-CM | POA: Diagnosis present

## 2021-09-03 ENCOUNTER — Encounter: Payer: Medicare Other | Admitting: Vascular Surgery

## 2021-09-09 ENCOUNTER — Encounter: Payer: Self-pay | Admitting: Obstetrics and Gynecology

## 2021-09-09 ENCOUNTER — Other Ambulatory Visit: Payer: Self-pay

## 2021-09-09 ENCOUNTER — Ambulatory Visit: Payer: Medicare Other | Admitting: Obstetrics and Gynecology

## 2021-09-09 VITALS — Ht 64.0 in | Wt 167.0 lb

## 2021-09-09 DIAGNOSIS — R9389 Abnormal findings on diagnostic imaging of other specified body structures: Secondary | ICD-10-CM

## 2021-09-09 DIAGNOSIS — N950 Postmenopausal bleeding: Secondary | ICD-10-CM

## 2021-09-09 NOTE — Progress Notes (Signed)
71 y.o GYN presents for Surgical Consult for Postmenopausal AUB. C/o constant bleeding x 2 months, abdominal/pelvic pain 5/10,  frequent syncopes, she fainted yesterday after Dialysis. ?

## 2021-09-09 NOTE — Patient Instructions (Signed)
Hysteroscopy ?Hysteroscopy is a procedure used to look inside a woman's womb (uterus). This may be done for various reasons, including: ?To look for tumors and other growths in the uterus. ?To evaluate abnormal bleeding, fibroid tumors, polyps, scar tissue, or uterine cancer. ?To determine why a woman is unable to get pregnant or has had repeated pregnancy losses. ?To locate an IUD (intrauterine device). ?To place a birth control device into the fallopian tubes. ?During this procedure, a thin, flexible tube with a small light and camera (hysteroscope) is used to examine the uterus. The camera sends images to a monitor in the room so that your health care provider can view the inside of your uterus. A hysteroscopy should be done right after a menstrual period. ?Tell a health care provider about: ?Any allergies you have. ?All medicines you are taking, including vitamins, herbs, eye drops, creams, and over-the-counter medicines. ?Any problems you or family members have had with anesthetic medicines. ?Any blood disorders you have. ?Any surgeries you have had. ?Any medical conditions you have. ?Whether you are pregnant or may be pregnant. ?Whether you have been diagnosed with an STI (sexually transmitted infection) or you think you have an STI. ?What are the risks? ?Generally, this is a safe procedure. However, problems may occur, including: ?Excessive bleeding. ?Infection. ?Damage to the uterus or other structures or organs. ?Allergic reaction to medicines or fluids that are used in the procedure. ?What happens before the procedure? ?Staying hydrated ?Follow instructions from your health care provider about hydration, which may include: ?Up to 2 hours before the procedure - you may continue to drink clear liquids, such as water, clear fruit juice, black coffee, and plain tea. ?Eating and drinking restrictions ?Follow instructions from your health care provider about eating and drinking, which may include: ?8 hours  before the procedure - stop eating solid foods and drink clear liquids only. ?2 hours before the procedure - stop drinking clear liquids. ?Medicines ?Ask your health care provider about: ?Changing or stopping your regular medicines. This is especially important if you are taking diabetes medicines or blood thinners. ?Taking medicines such as aspirin and ibuprofen. These medicines can thin your blood. Do not take these medicines unless your health care provider tells you to take them. ?Taking over-the-counter medicines, vitamins, herbs, and supplements. ?Medicine may be placed in your cervix the day before the procedure. This medicine causes the cervix to open (dilate). The larger opening makes it easier for the hysteroscope to be inserted into the uterus during the procedure. ?General instructions ?Ask your health care provider: ?What steps will be taken to help prevent infection. These steps may include: ?Washing skin with a germ-killing soap. ?Taking antibiotic medicine. ?Do not use any products that contain nicotine or tobacco for at least 4 weeks before the procedure. These products include cigarettes, chewing tobacco, and vaping devices, such as e-cigarettes. If you need help quitting, ask your health care provider. ?Plan to have a responsible adult take you home from the hospital or clinic. ?Plan to have a responsible adult care for you for the time you are told after you leave the hospital or clinic. This is important. ?Empty your bladder before the procedure begins. ?What happens during the procedure? ?An IV will be inserted into one of your veins. ?You may be given: ?A medicine to help you relax (sedative). ?A medicine that numbs the area around the cervix (local anesthetic). ?A medicine to make you fall asleep (general anesthetic). ?A hysteroscope will be inserted through your vagina  and into your uterus. ?Air or fluid will be used to enlarge your uterus to allow your health care provider to see it better.  The amount of fluid used will be carefully checked throughout the procedure. ?In some cases, tissue may be gently scraped from inside the uterus and sent to a lab for testing (biopsy). ?The procedure may vary among health care providers and hospitals. ?What can I expect after the procedure? ?Your blood pressure, heart rate, breathing rate, and blood oxygen level will be monitored until you leave the hospital or clinic. ?You may have cramps. You may be given medicines for this. ?You may have bleeding, which may vary from light spotting to menstrual-like bleeding. This is normal. ?If you had a biopsy, it is up to you to get the results. Ask your health care provider, or the department that is doing the procedure, when your results will be ready. ?Follow these instructions at home: ?Activity ?Rest as told by your health care provider. ?Return to your normal activities as told by your health care provider. Ask your health care provider what activities are safe for you. ?If you were given a sedative during the procedure, it can affect you for several hours. Do not drive or operate machinery until your health care provider says that it is safe. ?Medicines ?Do not take aspirin or other NSAIDs during recovery, as told by your healthcare provider. It can increase the risk of bleeding. ?Ask your health care provider if the medicine prescribed to you: ?Requires you to avoid driving or using machinery. ?Can cause constipation. You may need to take these actions to prevent or treat constipation: ?Drink enough fluid to keep your urine pale yellow. ?Take over-the-counter or prescription medicines. ?Eat foods that are high in fiber, such as beans, whole grains, and fresh fruits and vegetables. ?Limit foods that are high in fat and processed sugars, such as fried or sweet foods. ?General instructions ?Do not douche, use tampons, or have sex for 2 weeks after the procedure, or until your health care provider approves. ?Do not take  baths, swim, or use a hot tub until your health care provider approves. Take showers instead of baths for 2 weeks, or for as long as told by your health care provider. ?Keep all follow-up visits. This is important. ?Contact a health care provider if: ?You feel dizzy or lightheaded. ?You feel nauseous. ?You have abnormal vaginal discharge. ?You have a rash. ?You have pain that does not get better with medicine. ?You have chills. ?Get help right away if: ?You have bleeding that is heavier than a normal menstrual period. ?You have a fever. ?You have pain or cramps that get worse. ?You develop new abdominal pain. ?You faint. ?You have pain in your shoulder. ?You are short of breath. ?Summary ?Hysteroscopy is a procedure that is used to look inside a woman's womb (uterus). ?After the procedure, you may have bleeding, which varies from light spotting to menstrual-like bleeding. This is normal. You may also have cramps. ?Do not douche, use tampons, or have sex for 2 weeks after the procedure, or until your health care provider approves. ?Plan to have a responsible adult take you home from the hospital or clinic. ?This information is not intended to replace advice given to you by your health care provider. Make sure you discuss any questions you have with your health care provider. ?Document Revised: 02/17/2021 Document Reviewed: 01/22/2020 ?Elsevier Patient Education ? [place]. ? ?

## 2021-09-09 NOTE — Progress Notes (Signed)
MS Knoles presents to discuss Hysteroscopy. Pt was scheduled for hysteroscopy last Oct due to PMB and probable endometrial polyp on U/S. Had to cancel due to family issues. ?Continues to have vaginal bleeding to some degree on a daily basis. ? ?ESRD on 3 times a week dialysis now. ? ?PE AF VSS ?Lungs clear Heart RRR ?Abd soft + BS ? ? ?A/P PMB ?       Probable endometrial polyp ? ?Hysteroscopy D & C reviewed with pt. R/B/Post op care discussed. Information provided. ?Pt desires to proceed. Will f/u with post op appt.  ?

## 2021-09-14 ENCOUNTER — Encounter: Payer: Self-pay | Admitting: Physician Assistant

## 2021-09-14 ENCOUNTER — Other Ambulatory Visit: Payer: Self-pay

## 2021-09-14 ENCOUNTER — Ambulatory Visit (INDEPENDENT_AMBULATORY_CARE_PROVIDER_SITE_OTHER): Payer: Medicare Other | Admitting: Physician Assistant

## 2021-09-14 VITALS — BP 104/57 | HR 115 | Temp 96.6°F | Ht 64.0 in | Wt 163.0 lb

## 2021-09-14 DIAGNOSIS — N186 End stage renal disease: Secondary | ICD-10-CM

## 2021-09-14 DIAGNOSIS — Z992 Dependence on renal dialysis: Secondary | ICD-10-CM | POA: Diagnosis not present

## 2021-09-14 NOTE — Progress Notes (Signed)
?POST OPERATIVE OFFICE NOTE ? ? ? ?CC:  F/u for surgery ? ?HPI:  This is a 71 y.o. female who is s/p first stage basilic fistula creation for ESRD now on HD via right TDC placed else wear on 07/06/21 by Dr. Virl Cagey.   ? ?Pt returns today for follow up.  Pt states she has numbness occasionally in the left UE, but not everyday.  She denies pain, loss of motor or loss of sensation.  She has HD on MWF next door to our office.  She denise SOB, fever or chills. ? ?Allergies  ?Allergen Reactions  ? Ace Inhibitors Other (See Comments)  ?  cough  ? Codeine Itching and Swelling  ? Morphine And Related Other (See Comments)  ?  Loss of consciousness, feeling sick, faint  ? Penicillins Itching and Swelling  ?  Did it involve swelling of the face/tongue/throat, SOB, or low BP? N ?Did it involve sudden or severe rash/hives, skin peeling, or any reaction on the inside of your mouth or nose? N ?Did you need to seek medical attention at a hospital or doctor's office? N ?When did it last happen?   45 years ago    ?If all above answers are "NO", may proceed with cephalosporin use. ?  ? ? ?Current Outpatient Medications  ?Medication Sig Dispense Refill  ? clopidogrel (PLAVIX) 75 MG tablet Take 1 tablet (75 mg total) by mouth daily with breakfast. (Patient taking differently: Take 75 mg by mouth every evening.) 30 tablet 2  ? insulin glargine, 2 Unit Dial, (TOUJEO MAX SOLOSTAR) 300 UNIT/ML Solostar Pen Inject 32 Units into the skin at bedtime.    ? rosuvastatin (CRESTOR) 20 MG tablet Take 1 tablet (20 mg total) by mouth daily. (Patient taking differently: Take 20 mg by mouth every evening.) 90 tablet 2  ? Vitamin D, Ergocalciferol, (DRISDOL) 1.25 MG (50000 UNIT) CAPS capsule Take 50,000 Units by mouth every 30 (thirty) days.    ? amLODipine (NORVASC) 5 MG tablet Take 1 tablet (5 mg total) by mouth daily. (Patient taking differently: Take 5 mg by mouth every evening.) 90 tablet 2  ? irbesartan (AVAPRO) 300 MG tablet Take 300 mg by mouth  every evening.    ? oxyCODONE-acetaminophen (PERCOCET) 5-325 MG tablet Take 1 tablet by mouth every 6 (six) hours as needed for severe pain. 6 tablet 0  ? ?No current facility-administered medications for this visit.  ? ? ? ROS:  See HPI ? ?Physical Exam: ? ?Findings:  ?+--------------------+----------+-----------------+--------+  ?AVF                 PSV (cm/s)Flow Vol (mL/min)Comments  ?+--------------------+----------+-----------------+--------+  ?Native artery inflow   298          1707                 ?+--------------------+----------+-----------------+--------+  ?AVF Anastomosis        882                               ?+--------------------+----------+-----------------+--------+  ? ?   ?+------------+----------+-------------+----------+----------------+  ?OUTFLOW VEINPSV (cm/s)Diameter (cm)Depth (cm)    Describe      ?+------------+----------+-------------+----------+----------------+  ?Shoulder       466        0.74        1.57                     ?+------------+----------+-------------+----------+----------------+  ?Prox UA  227        0.74        1.31   competing branch  ?+------------+----------+-------------+----------+----------------+  ?Mid UA         189        0.72        0.94                     ?+------------+----------+-------------+----------+----------------+  ?Dist UA        409        0.84        0.80                     ?+------------+----------+-------------+----------+----------------+  ?AC Fossa       568        0.61        0.44                     ?+------------+----------+-------------+----------+----------------+  ? ?   ? ?   ?Summary:  ?Patent arteriovenous fistula ? ?Incision:  well healed ?Extremities:  excellent thrill in fistula ?Neuro: sensation intact, motor intact and grip 5/5 ?Lungs non labored breathing at rest with some SOB with activity.  This is her baseline. ?Heart RRR ? ? ?Assessment/Plan:  This is a 71 y.o. female  who is s/p:left basilic first stage fistula with good maturity on duplex reveals diameter > 0.6 cm with the depth > 0.6 cm .  We will schedule her for second stage Basilic fistula transposition with Dr. Virl Cagey.   ? ? ? ?Dorothy Harmon ?PA-C ?[facility] ?[phone] ? ? ?Clinic MD:  Dorothy Harmon ?

## 2021-09-17 ENCOUNTER — Other Ambulatory Visit: Payer: Self-pay

## 2021-09-22 ENCOUNTER — Other Ambulatory Visit: Payer: Self-pay

## 2021-09-22 ENCOUNTER — Encounter (HOSPITAL_COMMUNITY): Payer: Self-pay | Admitting: Vascular Surgery

## 2021-09-22 IMAGING — US US RENAL
1 series · 13 of 25 positions shown · non-contrast
Comparison: 03/05/2018

CLINICAL DATA: Acute renal insufficiency

EXAM:
RENAL / URINARY TRACT ULTRASOUND COMPLETE

[Series 1: us renal · 0.23mm/px · 13 of 39 slices shown]
[im 1/39]
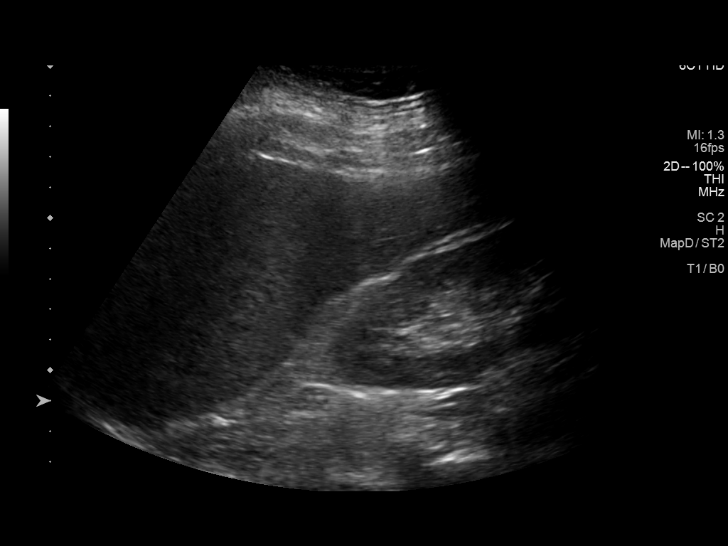
[im 4/39]
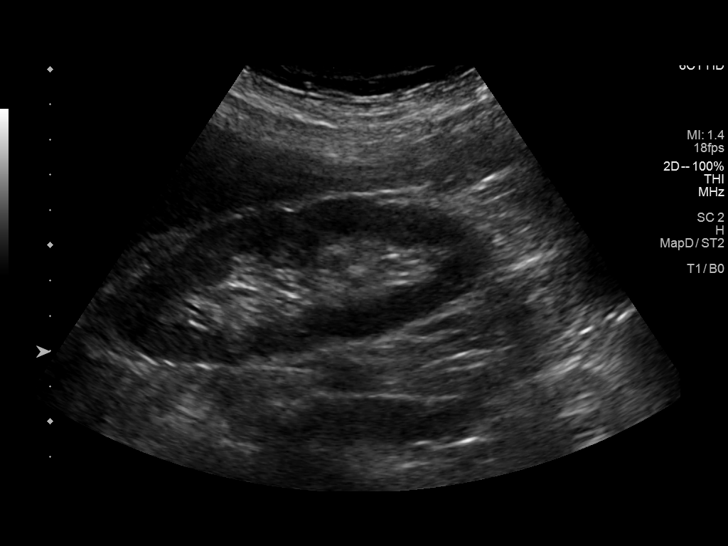
[im 7/39]
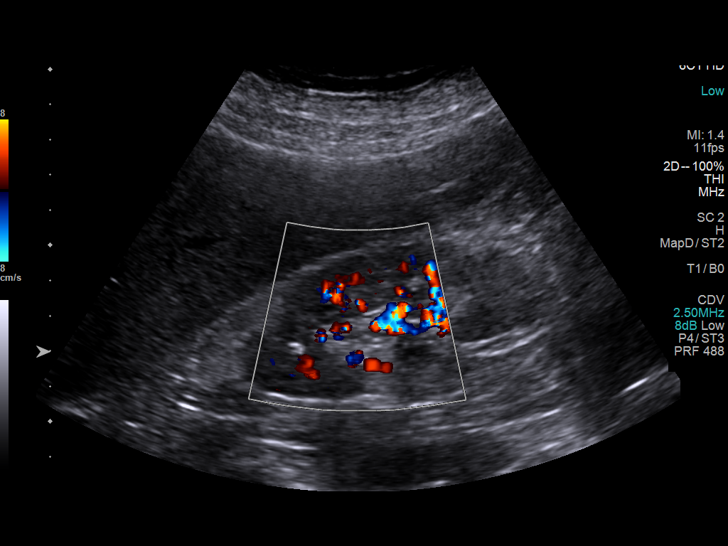
[im 10/39]
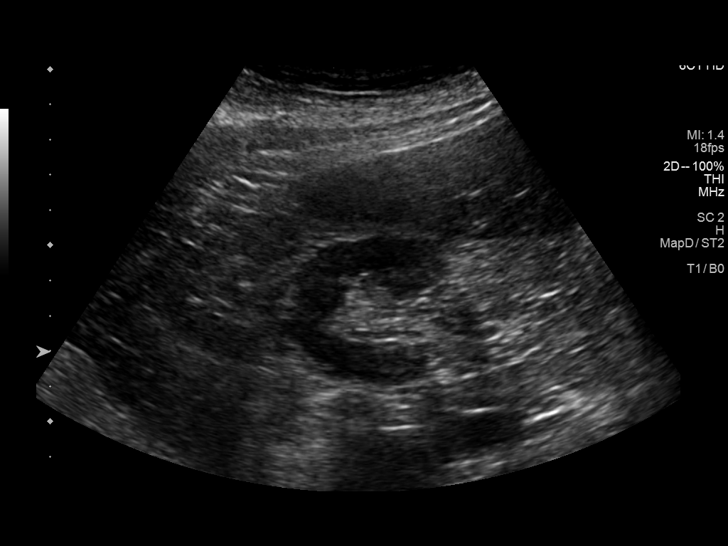
[im 13/39]
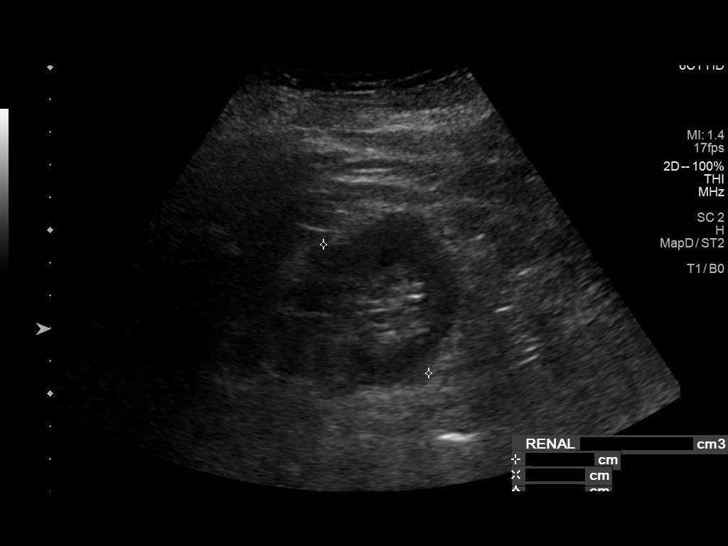
[im 16/39]
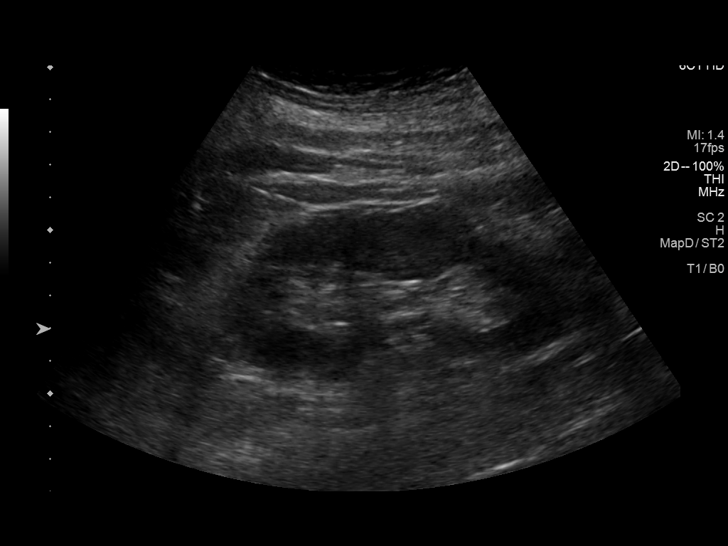
[im 20/39]
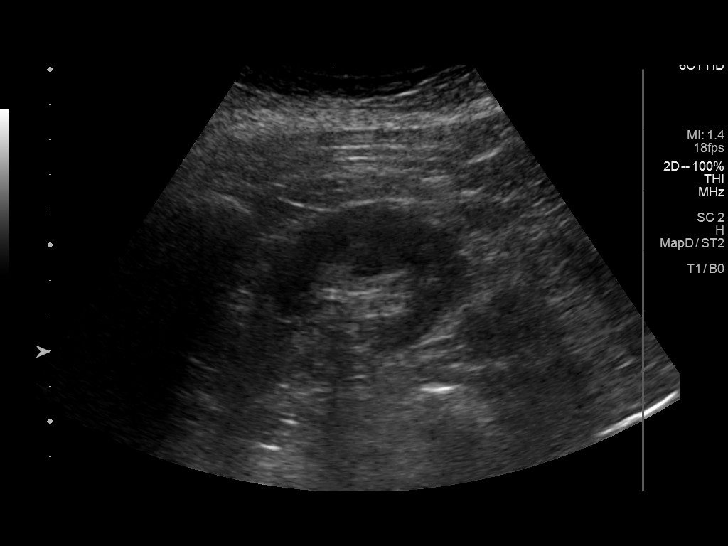
[im 23/39]
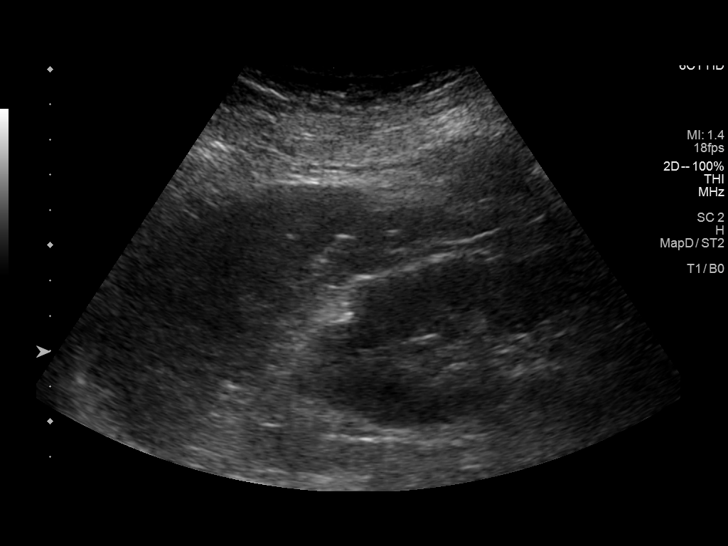
[im 26/39]
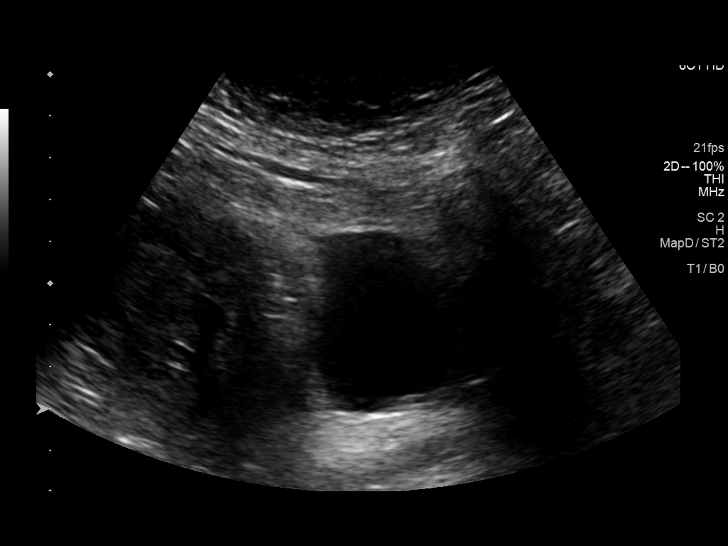
[im 29/39]
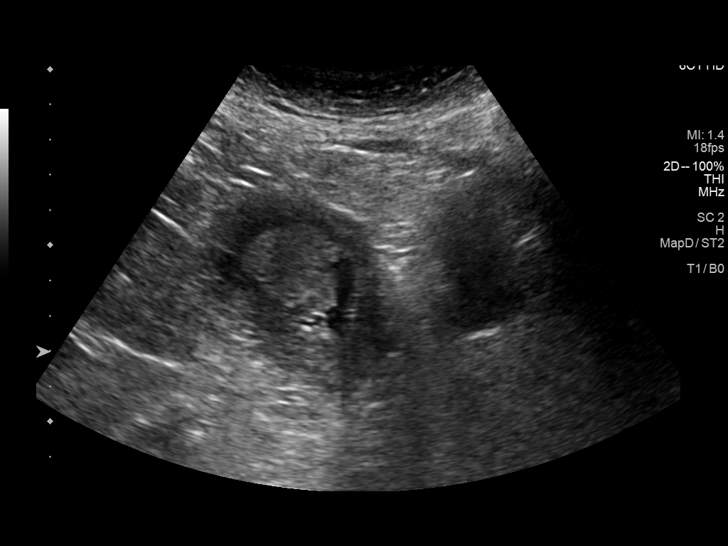
[im 32/39]
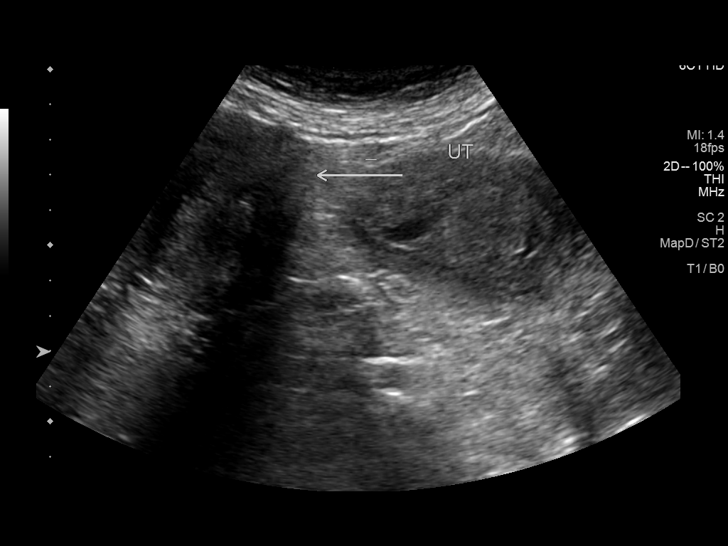
[im 35/39]
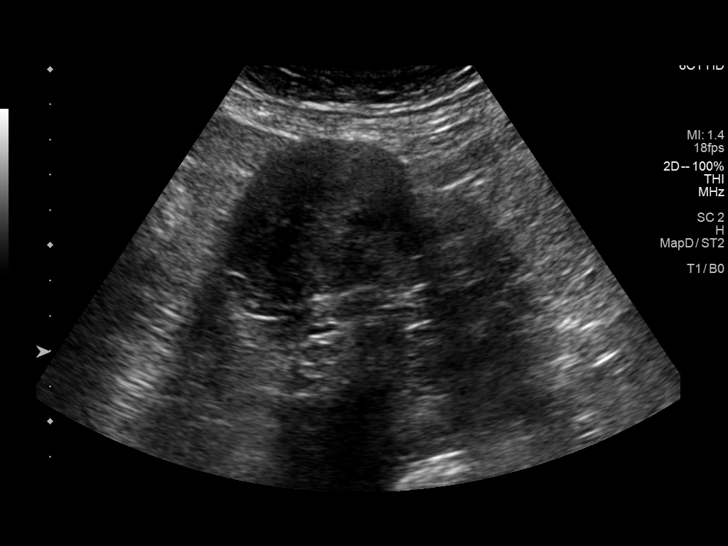
[im 39/39]
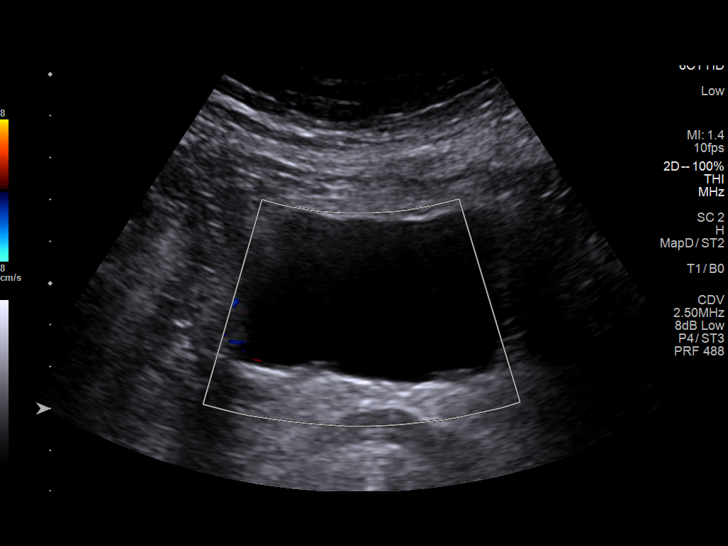

[13 of 25 positions shown; findings below may reference images not displayed]

FINDINGS: Right Kidney:

Renal measurements: 10.9 x 3.9 x 4.5 cm = volume: 99.4 mL.
Echogenicity within normal limits. No mass or hydronephrosis
visualized.

Left Kidney:

Renal measurements: 10.7 x 4.7 x 5.1 cm = volume: 135.4 mL.
Echogenicity within normal limits. No mass or hydronephrosis
visualized.

Bladder:

Appears normal for degree of bladder distention.

Other:

Incidental evaluation of the pelvic viscera reveals likely
endometrial mass versus focal thickening measuring up to 21 mm, with
significant endometrial fluid identified. In addition, there is a
solid mass versus complex cyst within the right adnexa measuring
x 5.7 x 5.1 cm. Given the imaging characteristics, further
evaluation with endovaginal pelvic ultrasound or pelvic MRI is
recommended.
IMPRESSION: 1. Grossly normal appearance of the kidneys.
2. Focal endometrial thickening versus endometrial mass, with
associated endometrial fluid. There is also an indeterminate right
adnexal mass. Further evaluation with endovaginal pelvic ultrasound
or pelvic MRI is recommended for better characterization.

These results will be called to the ordering clinician or
representative by the Radiologist Assistant, and communication
documented in the PACS or [REDACTED].

## 2021-09-22 NOTE — Progress Notes (Signed)
DUE TO COVID-19 ONLY TWO VISITORS ARE ALLOWED TO COME WITH YOU AND STAY IN THE SURGICAL WAITING ROOM ONLY DURING PRE OP AND PROCEDURE DAY OF SURGERY.  ? ?PCP - Dr Domenick Gong ?Cardiologist - n/a ? ?Chest x-ray - n/a ?EKG - 08/10/21 ?Stress Test - n/a ?ECHO - 03/05/19 ?Cardiac Cath - 12/01/03 ? ?ICD Pacemaker/Loop - n/a ? ?Sleep Study -  Yes ?CPAP - does not use CPAP ? ?THE NIGHT BEFORE SURGERY, take 17 units of Insulin Glargine Insulin.    ? ?If your CBG is greater than 220 mg/dL, you may take ? of your sliding scale (correction) dose of insulin. ? ?If your blood sugar is less than 70 mg/dL, you will need to treat for low blood sugar: ?Treat a low blood sugar (less than 70 mg/dL) with ? cup of clear juice (cranberry or apple), 4 glucose tablets, OR glucose gel. ?Recheck blood sugar in 15 minutes after treatment (to make sure it is greater than 70 mg/dL). If your blood sugar is not greater than 70 mg/dL on recheck, call [phone] for further instructions. ? ?Anesthesia review: Yes ? ?STOP now taking any Aspirin (unless otherwise instructed by your surgeon), Aleve, Naproxen, Ibuprofen, Motrin, Advil, Goody's, BC's, all herbal medications, fish oil, and all vitamins.  ? ?Coronavirus Screening ?Covid test n/a.   ?Do you have any of the following symptoms:  ?Cough yes/no: No ?Fever (>100.5F)  yes/no: No ?Runny nose yes/no: No ?Sore throat yes/no: No ?Difficulty breathing/shortness of breath  yes/no: No ? ?Have you traveled in the last 14 days and where? yes/no: No ? ?Patient verbalized understanding of instructions that were given via phone. ?

## 2021-09-23 ENCOUNTER — Other Ambulatory Visit: Payer: Self-pay

## 2021-09-23 ENCOUNTER — Other Ambulatory Visit (HOSPITAL_COMMUNITY): Payer: Self-pay

## 2021-09-23 ENCOUNTER — Ambulatory Visit (HOSPITAL_COMMUNITY)
Admission: RE | Admit: 2021-09-23 | Discharge: 2021-09-23 | Disposition: A | Discharge status: Home / Self Care | Payer: Medicare Other | Source: Ambulatory Visit | Attending: Vascular Surgery | Admitting: Vascular Surgery

## 2021-09-23 ENCOUNTER — Ambulatory Visit (HOSPITAL_COMMUNITY): Payer: Medicare Other | Admitting: Physician Assistant

## 2021-09-23 ENCOUNTER — Encounter (HOSPITAL_COMMUNITY): Payer: Self-pay | Admitting: Vascular Surgery

## 2021-09-23 ENCOUNTER — Encounter (HOSPITAL_COMMUNITY)
Admission: RE | Disposition: A | Discharge status: Home / Self Care | Payer: Self-pay | Source: Ambulatory Visit | Attending: Vascular Surgery

## 2021-09-23 ENCOUNTER — Ambulatory Visit (HOSPITAL_BASED_OUTPATIENT_CLINIC_OR_DEPARTMENT_OTHER): Payer: Medicare Other | Admitting: Physician Assistant

## 2021-09-23 DIAGNOSIS — I120 Hypertensive chronic kidney disease with stage 5 chronic kidney disease or end stage renal disease: Secondary | ICD-10-CM

## 2021-09-23 DIAGNOSIS — G4730 Sleep apnea, unspecified: Secondary | ICD-10-CM | POA: Insufficient documentation

## 2021-09-23 DIAGNOSIS — N186 End stage renal disease: Secondary | ICD-10-CM | POA: Insufficient documentation

## 2021-09-23 DIAGNOSIS — Z794 Long term (current) use of insulin: Secondary | ICD-10-CM

## 2021-09-23 DIAGNOSIS — E1122 Type 2 diabetes mellitus with diabetic chronic kidney disease: Secondary | ICD-10-CM | POA: Diagnosis not present

## 2021-09-23 DIAGNOSIS — E119 Type 2 diabetes mellitus without complications: Secondary | ICD-10-CM | POA: Insufficient documentation

## 2021-09-23 DIAGNOSIS — I69311 Memory deficit following cerebral infarction: Secondary | ICD-10-CM | POA: Diagnosis not present

## 2021-09-23 DIAGNOSIS — N185 Chronic kidney disease, stage 5: Secondary | ICD-10-CM

## 2021-09-23 DIAGNOSIS — I770 Arteriovenous fistula, acquired: Secondary | ICD-10-CM

## 2021-09-23 DIAGNOSIS — Z992 Dependence on renal dialysis: Secondary | ICD-10-CM | POA: Insufficient documentation

## 2021-09-23 DIAGNOSIS — Z79899 Other long term (current) drug therapy: Secondary | ICD-10-CM | POA: Diagnosis not present

## 2021-09-23 HISTORY — PX: BASCILIC VEIN TRANSPOSITION: SHX5742

## 2021-09-23 HISTORY — DX: Arteriovenous fistula, acquired: I77.0

## 2021-09-23 LAB — POCT I-STAT, CHEM 8
BUN: 35 mg/dL — ABNORMAL HIGH (ref 8–23)
Calcium, Ion: 1.11 mmol/L — ABNORMAL LOW (ref 1.15–1.40)
Chloride: 98 mmol/L (ref 98–111)
Creatinine, Ser: 4 mg/dL — ABNORMAL HIGH (ref 0.44–1.00)
Glucose, Bld: 187 mg/dL — ABNORMAL HIGH (ref 70–99)
HCT: 31 % — ABNORMAL LOW (ref 36.0–46.0)
Hemoglobin: 10.5 g/dL — ABNORMAL LOW (ref 12.0–15.0)
Potassium: 3.3 mmol/L — ABNORMAL LOW (ref 3.5–5.1)
Sodium: 137 mmol/L (ref 135–145)
TCO2: 28 mmol/L (ref 22–32)

## 2021-09-23 LAB — GLUCOSE, CAPILLARY
Glucose-Capillary: 172 mg/dL — ABNORMAL HIGH (ref 70–99)
Glucose-Capillary: 171 mg/dL — ABNORMAL HIGH (ref 70–99)

## 2021-09-23 SURGERY — TRANSPOSITION, VEIN, BASILIC
Anesthesia: Regional | Site: Arm Upper | Laterality: Left

## 2021-09-23 MED ORDER — PROMETHAZINE HCL 25 MG/ML IJ SOLN: 6.2500 mg | INTRAMUSCULAR | Status: DC | PRN

## 2021-09-23 MED ORDER — HEPARIN SODIUM (PORCINE) 1000 UNIT/ML IJ SOLN
INTRAMUSCULAR | Status: DC | PRN
  Administered 2021-09-23: 2000 [IU] via INTRAVENOUS

## 2021-09-23 MED ORDER — HEPARIN 6000 UNIT IRRIGATION SOLUTION
Status: DC | PRN
  Administered 2021-09-23: 1

## 2021-09-23 MED ORDER — VANCOMYCIN HCL IN DEXTROSE 1-5 GM/200ML-% IV SOLN
1000.0000 mg | INTRAVENOUS | Status: AC
  Administered 2021-09-23: 1000 mg via INTRAVENOUS
  Filled 2021-09-23: qty 200

## 2021-09-23 MED ORDER — DIPHENHYDRAMINE HCL 50 MG/ML IJ SOLN
INTRAMUSCULAR | Status: DC | PRN
  Administered 2021-09-23: 12.5 mg via INTRAVENOUS

## 2021-09-23 MED ORDER — PHENYLEPHRINE HCL-NACL 20-0.9 MG/250ML-% IV SOLN
INTRAVENOUS | Status: DC | PRN
  Administered 2021-09-23: 50 ug/min via INTRAVENOUS

## 2021-09-23 MED ORDER — LIDOCAINE HCL (PF) 1 % IJ SOLN
INTRAMUSCULAR | Status: AC
  Filled 2021-09-23: qty 30

## 2021-09-23 MED ORDER — ROPIVACAINE HCL 5 MG/ML IJ SOLN
INTRAMUSCULAR | Status: DC | PRN
  Administered 2021-09-23: 30 mL via PERINEURAL

## 2021-09-23 MED ORDER — HEPARIN SODIUM (PORCINE) 1000 UNIT/ML IJ SOLN
INTRAMUSCULAR | Status: AC
  Filled 2021-09-23: qty 10

## 2021-09-23 MED ORDER — SODIUM CHLORIDE 0.9 % IV SOLN: INTRAVENOUS | Status: DC

## 2021-09-23 MED ORDER — CHLORHEXIDINE GLUCONATE 4 % EX LIQD: 60.0000 mL | Freq: Once | CUTANEOUS | Status: DC

## 2021-09-23 MED ORDER — ONDANSETRON HCL 4 MG/2ML IJ SOLN
INTRAMUSCULAR | Status: DC | PRN
  Administered 2021-09-23: 4 mg via INTRAVENOUS

## 2021-09-23 MED ORDER — LIDOCAINE-EPINEPHRINE 1 %-1:100000 IJ SOLN
INTRAMUSCULAR | Status: AC
  Filled 2021-09-23: qty 1

## 2021-09-23 MED ORDER — FENTANYL CITRATE (PF) 250 MCG/5ML IJ SOLN
INTRAMUSCULAR | Status: DC | PRN
  Administered 2021-09-23 (×2): 50 ug via INTRAVENOUS

## 2021-09-23 MED ORDER — OXYCODONE HCL 5 MG PO TABS: 5.0000 mg | ORAL_TABLET | Freq: Once | ORAL | Status: DC | PRN

## 2021-09-23 MED ORDER — ORAL CARE MOUTH RINSE: 15.0000 mL | Freq: Once | OROMUCOSAL | Status: AC

## 2021-09-23 MED ORDER — OXYCODONE-ACETAMINOPHEN 5-325 MG PO TABS
1.0000 | ORAL_TABLET | Freq: Four times a day (QID) | ORAL | 0 refills | Status: DC | PRN
  Filled 2021-09-23: qty 12, 3d supply, fill #0

## 2021-09-23 MED ORDER — HEPARIN SODIUM (PORCINE) 1000 UNIT/ML IJ SOLN
1.6000 mL | Freq: Once | INTRAMUSCULAR | Status: AC
  Administered 2021-09-23: 1600 [IU] via INTRAVENOUS
  Filled 2021-09-23: qty 1.6

## 2021-09-23 MED ORDER — FENTANYL CITRATE (PF) 250 MCG/5ML IJ SOLN
INTRAMUSCULAR | Status: AC
  Filled 2021-09-23: qty 5

## 2021-09-23 MED ORDER — INSULIN ASPART 100 UNIT/ML IJ SOLN: 0.0000 [IU] | INTRAMUSCULAR | Status: DC | PRN

## 2021-09-23 MED ORDER — 0.9 % SODIUM CHLORIDE (POUR BTL) OPTIME
TOPICAL | Status: DC | PRN
  Administered 2021-09-23: 1000 mL

## 2021-09-23 MED ORDER — CHLORHEXIDINE GLUCONATE 0.12 % MT SOLN
15.0000 mL | Freq: Once | OROMUCOSAL | Status: AC
  Administered 2021-09-23: 15 mL via OROMUCOSAL
  Filled 2021-09-23: qty 15

## 2021-09-23 MED ORDER — PROPOFOL 500 MG/50ML IV EMUL
INTRAVENOUS | Status: DC | PRN
  Administered 2021-09-23: 50 ug/kg/min via INTRAVENOUS

## 2021-09-23 MED ORDER — HEPARIN 6000 UNIT IRRIGATION SOLUTION
Status: AC
  Filled 2021-09-23: qty 500

## 2021-09-23 MED ORDER — GLYCOPYRROLATE 0.2 MG/ML IJ SOLN
INTRAMUSCULAR | Status: DC | PRN
  Administered 2021-09-23: .2 mg via INTRAVENOUS

## 2021-09-23 MED ORDER — HYDROMORPHONE HCL 1 MG/ML IJ SOLN: 0.2500 mg | INTRAMUSCULAR | Status: DC | PRN

## 2021-09-23 MED ORDER — OXYCODONE HCL 5 MG/5ML PO SOLN: 5.0000 mg | Freq: Once | ORAL | Status: DC | PRN

## 2021-09-23 MED ORDER — GLYCOPYRROLATE PF 0.2 MG/ML IJ SOSY
PREFILLED_SYRINGE | INTRAMUSCULAR | Status: AC
  Filled 2021-09-23: qty 1

## 2021-09-23 MED ORDER — PROPOFOL 10 MG/ML IV BOLUS
INTRAVENOUS | Status: DC | PRN
  Administered 2021-09-23: 80 mg via INTRAVENOUS
  Administered 2021-09-23: 10 mg via INTRAVENOUS

## 2021-09-23 SURGICAL SUPPLY — 38 items
ARMBAND PINK RESTRICT EXTREMIT (MISCELLANEOUS) ×2 IMPLANT
BAG COUNTER SPONGE SURGICOUNT (BAG) ×2 IMPLANT
BNDG ELASTIC 6X10 VLCR STRL LF (GAUZE/BANDAGES/DRESSINGS) ×1 IMPLANT
CANISTER SUCT 3000ML PPV (MISCELLANEOUS) ×2 IMPLANT
CLIP LIGATING EXTRA MED SLVR (CLIP) ×2 IMPLANT
CLIP LIGATING EXTRA SM BLUE (MISCELLANEOUS) ×2 IMPLANT
COVER PROBE W GEL 5X96 (DRAPES) ×2 IMPLANT
DECANTER SPIKE VIAL GLASS SM (MISCELLANEOUS) ×2 IMPLANT
DERMABOND ADVANCED (GAUZE/BANDAGES/DRESSINGS) ×1
DERMABOND ADVANCED .7 DNX12 (GAUZE/BANDAGES/DRESSINGS) ×1 IMPLANT
ELECT REM PT RETURN 9FT ADLT (ELECTROSURGICAL) ×2
ELECTRODE REM PT RTRN 9FT ADLT (ELECTROSURGICAL) ×1 IMPLANT
GLOVE SRG 8 PF TXTR STRL LF DI (GLOVE) ×2 IMPLANT
GLOVE SURG POLYISO LF SZ8 (GLOVE) IMPLANT
GLOVE SURG UNDER POLY LF SZ8 (GLOVE) ×4
GOWN STRL REUS W/ TWL LRG LVL3 (GOWN DISPOSABLE) ×2 IMPLANT
GOWN STRL REUS W/TWL 2XL LVL3 (GOWN DISPOSABLE) ×2 IMPLANT
GOWN STRL REUS W/TWL LRG LVL3 (GOWN DISPOSABLE) ×4
KIT BASIN OR (CUSTOM PROCEDURE TRAY) ×2 IMPLANT
KIT TURNOVER KIT B (KITS) ×2 IMPLANT
NDL HYPO 25GX1X1/2 BEV (NEEDLE) ×1 IMPLANT
NEEDLE HYPO 25GX1X1/2 BEV (NEEDLE) ×2 IMPLANT
NS IRRIG 1000ML POUR BTL (IV SOLUTION) ×2 IMPLANT
PACK CV ACCESS (CUSTOM PROCEDURE TRAY) ×2 IMPLANT
PAD ARMBOARD 7.5X6 YLW CONV (MISCELLANEOUS) ×4 IMPLANT
SLING ARM FOAM STRAP LRG (SOFTGOODS) IMPLANT
SLING ARM FOAM STRAP MED (SOFTGOODS) IMPLANT
SUT MNCRL AB 4-0 PS2 18 (SUTURE) ×2 IMPLANT
SUT PROLENE 6 0 BV (SUTURE) ×3 IMPLANT
SUT PROLENE 7 0 BV 1 (SUTURE) IMPLANT
SUT SILK 2 0 SH (SUTURE) IMPLANT
SUT VIC AB 2-0 CT1 27 (SUTURE) ×2
SUT VIC AB 2-0 CT1 TAPERPNT 27 (SUTURE) ×1 IMPLANT
SUT VIC AB 3-0 SH 27 (SUTURE) ×4
SUT VIC AB 3-0 SH 27X BRD (SUTURE) ×2 IMPLANT
TOWEL GREEN STERILE (TOWEL DISPOSABLE) ×2 IMPLANT
UNDERPAD 30X36 HEAVY ABSORB (UNDERPADS AND DIAPERS) ×2 IMPLANT
WATER STERILE IRR 1000ML POUR (IV SOLUTION) ×2 IMPLANT

## 2021-09-23 NOTE — Anesthesia Procedure Notes (Signed)
Procedure Name: LMA Insertion ?Date/Time: 09/23/2021 8:09 AM ?Performed by: Mariea Clonts, CRNA ?Pre-anesthesia Checklist: Patient identified, Emergency Drugs available, Suction available and Patient being monitored ?Patient Re-evaluated:Patient Re-evaluated prior to induction ?Oxygen Delivery Method: Circle System Utilized ?Preoxygenation: Pre-oxygenation with 100% oxygen ?Induction Type: IV induction ?Ventilation: Mask ventilation without difficulty ?LMA: LMA inserted ?LMA Size: 4.0 ?Number of attempts: 1 ?Airway Equipment and Method: Bite block ?Placement Confirmation: positive ETCO2 ?Tube secured with: Tape ?Dental Injury: Teeth and Oropharynx as per pre-operative assessment  ? ? ? ? ?

## 2021-09-23 NOTE — Progress Notes (Signed)
Patient continues to rest in recliner chair awaiting a return call from daughter unable to reach her via phone message left to return call ?

## 2021-09-23 NOTE — Progress Notes (Signed)
Awaiting a return call from daughter attempted multiple calls without success.  Patient is awake and resting in recliner chair skin warm and dry respirations even unlabored ?

## 2021-09-23 NOTE — Anesthesia Procedure Notes (Signed)
Anesthesia Regional Block: Supraclavicular block  ? ?Pre-Anesthetic Checklist: , timeout performed,  Correct Patient, Correct Site, Correct Laterality,  Correct Procedure, Correct Position, site marked,  Risks and benefits discussed,  Surgical consent,  Pre-op evaluation,  At surgeon's request and post-op pain management ? ?Laterality: Left ? ?Prep: chloraprep     ?  ?Needles:  ?Injection technique: Single-shot ? ?Needle Type: Stimiplex   ? ? ?Needle Length: 9cm  ?Needle Gauge: 21  ? ? ? ?Additional Needles: ? ? ?Procedures:,,,, ultrasound used (permanent image in chart),,    ?Narrative:  ?Start time: 09/23/2021 7:24 AM ?End time: 09/23/2021 7:29 AM ?Injection made incrementally with aspirations every 5 mL. ? ?Performed by: Personally  ?Anesthesiologist: Lynda Rainwater, MD ? ? ? ? ?

## 2021-09-23 NOTE — Op Note (Signed)
? ? ?  NAME: Dorothy Harmon    MRN: [id number] ?DOB: December 01, 1950    DATE OF OPERATION: 09/23/2021 ? ?PREOP DIAGNOSIS:   ? ?End-stage renal disease ? ?POSTOP DIAGNOSIS:   ? ?Same ? ?PROCEDURE:  ?  ?Left arm second stage brachiobasilic fistula superficialization ? ?SURGEON: Broadus John ? ?ASSIST: Roxy Horseman, PA ? ?ANESTHESIA: General ? ?EBL: 10 mL ? ?INDICATIONS:  ? ? [name] is a 71 y.o. female with end-stage renal disease, who was previously undergone left-sided brachiobasilic fistula creation.  She presents today for the second stage transposition.  After discussing the risk and benefits, [name] elected to proceed. ? ?FINDINGS:  ? ?6 mm brachiobasilic fistula ? ?TECHNIQUE:  ? ?Prior to the OR, a left left arm block was performed by my anesthesia colleagues.  She was then rolled to the operating room and laid in the supine position.  Moderate anesthesia was induced and the patient was prepped and draped in standard fashion.  A timeout was performed. ? ?Case began with ultrasound insonation of the brachiobasilic fistula.  This was marked.  Next, a longitudinal incision was made along the bicep with a skip incision.  The fistula was located and looped.  Next, the fistula was mobilized with multiple branches ligated with 3-0 silk suture.  Once completely mobilized to the axilla, I elected to transpose the fistula using a tunneler as it was wrapped in a branch of the musculocutaneous nerve.  A tunneler was brought onto the field and tunneled in the subcutaneous plane from the antecubital fossa to the axilla.  The fistula was marked to ensure no kinking.  The patient was then heparinized with 2000 units IV heparin the fistula was controlled with vascular clamps and transected.  The fistula was then tunneled from the axilla down to the antecubital fossa.  Two 6-0 sutures were used to reanastomose the fistula in end-to-end fashion.  The fistula was backbled prior to completing the anastomosis to ensure  there was no thrombus or air.  Completion, there was an excellent thrill in the fistula appreciated throughout the upper arm. ? ?The wound bed was irrigated, hemostasis achieved with use of cautery and suture.  The 2, 6 cm skip incisions were closed with Vicryl suture in the adipose tissue with Monocryl and Dermabond at the skin.  A light Ace wrap was placed for dead space management. ? ?At case completion, the patient had a palpable thrill in her fistula, with a palpable radial pulse. ? ?Given the complexity of the case,  the assistant was necessary in order to expedient the procedure and safely perform the technical aspects of the operation.  The assistant provided traction and countertraction to assist with exposure of the artery and vein.  They also assisted with suture ligation of multiple venous branches.  They played a critical role in the anastomosis. These skills, especially following the Prolene suture for the anastomosis, could not have been adequately performed by a scrub tech assistant.  ? ? ?[name], MD ?[facility] ? ?DATE OF DICTATION:   09/23/2021 ? ?

## 2021-09-23 NOTE — Progress Notes (Signed)
Patient continues to await for family to transport home patient is alert talkative skin warm and dry did have lunch has ambulated to bathroom with assistance continues with sling to arm ?

## 2021-09-23 NOTE — H&P (Signed)
?POST OPERATIVE OFFICE NOTE ? ?Patient seen and examined in preop holding.  No complaints. ?No changes to medication history or physical exam since last seen in clinic. ?After discussing the risks and benefits of left arm fistula superficialization, Dorothy Harmon elected to proceed.  ? ?Broadus John MD ? ? ?CC:  F/u for surgery ? ?HPI:  This is a 71 y.o. female who is s/p first stage basilic fistula creation for ESRD now on HD via right TDC placed else wear on 07/06/21 by Dr. Virl Cagey.   ? ?Pt returns today for follow up.  Pt states she has numbness occasionally in the left UE, but not everyday.  She denies pain, loss of motor or loss of sensation.  She has HD on MWF next door to our office.  She denise SOB, fever or chills. ? ?Allergies  ?Allergen Reactions  ? Ace Inhibitors Other (See Comments)  ?  cough  ? Codeine Itching and Swelling  ? Morphine And Related Other (See Comments)  ?  Loss of consciousness, feeling sick, faint  ? Penicillins Itching and Swelling  ?  Did it involve swelling of the face/tongue/throat, SOB, or low BP? N ?Did it involve sudden or severe rash/hives, skin peeling, or any reaction on the inside of your mouth or nose? N ?Did you need to seek medical attention at a hospital or doctor's office? N ?When did it last happen?   45 years ago    ?If all above answers are "NO", may proceed with cephalosporin use. ?  ? ? ?Current Facility-Administered Medications  ?Medication Dose Route Frequency Provider Last Rate Last Admin  ? 0.9 %  sodium chloride infusion   Intravenous Continuous Broadus John, MD      ? chlorhexidine (HIBICLENS) 4 % liquid 4 application.  60 mL Topical Once Broadus John, MD      ? And  ? [START ON 09/24/2021] chlorhexidine (HIBICLENS) 4 % liquid 4 application.  60 mL Topical Once Broadus John, MD      ? insulin aspart (novoLOG) injection 0-7 Units  0-7 Units Subcutaneous Q2H PRN Lynda Rainwater, MD      ? vancomycin (VANCOCIN) IVPB 1000 mg/200 mL premix  1,000 mg  Intravenous 60 min Pre-Op Broadus John, MD      ? ? ? ROS:  See HPI ? ?Physical Exam: ? ?Findings:  ?+--------------------+----------+-----------------+--------+  ?AVF                 PSV (cm/s)Flow Vol (mL/min)Comments  ?+--------------------+----------+-----------------+--------+  ?Native artery inflow   298          1707                 ?+--------------------+----------+-----------------+--------+  ?AVF Anastomosis        882                               ?+--------------------+----------+-----------------+--------+  ? ?   ?+------------+----------+-------------+----------+----------------+  ?OUTFLOW VEINPSV (cm/s)Diameter (cm)Depth (cm)    Describe      ?+------------+----------+-------------+----------+----------------+  ?Shoulder       466        0.74        1.57                     ?+------------+----------+-------------+----------+----------------+  ?Prox UA        227        0.74  1.31   competing branch  ?+------------+----------+-------------+----------+----------------+  ?Mid UA         189        0.72        0.94                     ?+------------+----------+-------------+----------+----------------+  ?Dist UA        409        0.84        0.80                     ?+------------+----------+-------------+----------+----------------+  ?AC Fossa       568        0.61        0.44                     ?+------------+----------+-------------+----------+----------------+  ? ?   ? ?   ?Summary:  ?Patent arteriovenous fistula ? ?Incision:  well healed ?Extremities:  excellent thrill in fistula ?Neuro: sensation intact, motor intact and grip 5/5 ?Lungs non labored breathing at rest with some SOB with activity.  This is her baseline. ?Heart RRR ? ? ?Assessment/Plan:  This is a 71 y.o. female who is s/p:left basilic first stage fistula with good maturity on duplex reveals diameter > 0.6 cm with the depth > 0.6 cm .  We will schedule her for second stage  Basilic fistula transposition with Dr. Virl Cagey.   ? ? ? ?Broadus John ?PA-C ?[facility] ?[phone] ? ? ?Clinic MD:  Carlis Abbott ?

## 2021-09-23 NOTE — Anesthesia Preprocedure Evaluation (Signed)
Anesthesia Evaluation  ?Patient identified by MRN, date of birth, ID band ?Patient awake ? ? ? ?Reviewed: ?Allergy & Precautions, NPO status , Patient's Chart, lab work & pertinent test results ? ?History of Anesthesia Complications ?Negative for: history of anesthetic complications ? ?Airway ?Mallampati: III ? ?TM Distance: >3 FB ?Neck ROM: Full ? ? ? Dental ? ?(+) Dental Advisory Given, Teeth Intact ?  ?Pulmonary ?sleep apnea ,  ?  ?Pulmonary exam normal ? ? ? ? ? ? ? Cardiovascular ?hypertension, Pt. on medications ?Normal cardiovascular exam ? ? ?'20 TTE - Moderate concentric LVH. EF 55%. Grade I (impaired) diastolic dysfunction, normal LAP. Left atrial cavity is mildly dilated. 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  ?Trace AI. Mild tricuspid regurgitation.  ? ?  ?Neuro/Psych ?Seizures -, Well Controlled,  PSYCHIATRIC DISORDERS Depression  Neuromuscular disease CVA (memory issues), Residual Symptoms   ? GI/Hepatic ?negative GI ROS, Neg liver ROS,   ?Endo/Other  ?diabetes, Type 2, Insulin Dependent ?Obesity ? ? Renal/GU ?ESRFRenal disease  ? ?  ?Musculoskeletal ? ?(+) Arthritis , Fibromyalgia - ? Abdominal ?  ?Peds ? Hematology ? ?On plavix ?   ?Anesthesia Other Findings ? ? Reproductive/Obstetrics ? ?  ? ? ? ? ? ? ? ? ? ? ? ? ? ?  ?  ? ? ? ? ? ? ? ? ?Anesthesia Physical ? ?Anesthesia Plan ? ?ASA: 4 ? ?Anesthesia Plan: Regional  ? ?Post-op Pain Management: Regional block and Minimal or no pain anticipated  ? ?Induction: Intravenous ? ?PONV Risk Score and Plan: 2 and Propofol infusion and Treatment may vary due to age or medical condition ? ?Airway Management Planned: Natural Airway and Simple Face Mask ? ?Additional Equipment: None ? ?Intra-op Plan:  ? ?Post-operative Plan:  ? ?Informed Consent: I have reviewed the patients History and Physical, chart, labs and discussed the procedure including the risks, benefits and  alternatives for the proposed anesthesia with the patient or authorized representative who has indicated his/her understanding and acceptance.  ? ? ? ? ? ?Plan Discussed with: CRNA, Anesthesiologist and Surgeon ? ?Anesthesia Plan Comments:   ? ? ? ? ? ? ?Anesthesia Quick Evaluation ? ?

## 2021-09-23 NOTE — Anesthesia Postprocedure Evaluation (Signed)
Anesthesia Post Note ? ?Patient: Dorothy Harmon ? ?Procedure(s) Performed: LEFT UPPER EXTREMITY BASILIC SECOND STAGE TRANSPOSITION (Left: Arm Upper) ? ?  ? ?Patient location during evaluation: PACU ?Anesthesia Type: Regional and General ?Level of consciousness: awake and alert ?Pain management: pain level controlled ?Vital Signs Assessment: post-procedure vital signs reviewed and stable ?Respiratory status: spontaneous breathing, nonlabored ventilation and respiratory function stable ?Cardiovascular status: blood pressure returned to baseline and stable ?Postop Assessment: no apparent nausea or vomiting ?Anesthetic complications: no ? ? ?No notable events documented. ? ?Last Vitals:  ?Vitals:  ? 09/23/21 0921 09/23/21 0936  ?BP: (!) 127/55 124/60  ?Pulse: 79 77  ?Resp: 15 16  ?Temp:  36.8 ?C  ?SpO2: 96% 95%  ?  ?Last Pain:  ?Vitals:  ? 09/23/21 0936  ?TempSrc:   ?PainSc: 0-No pain  ? ? ?  ?  ?  ?  ?  ?  ? ?Lynda Rainwater ? ? ? ? ?

## 2021-09-23 NOTE — Anesthesia Procedure Notes (Signed)
Procedure Name: [place] ?Date/Time: 09/23/2021 7:34 AM ?Performed by: Mariea Clonts, CRNA ?Pre-anesthesia Checklist: Patient identified, Emergency Drugs available, Suction available, Patient being monitored and Timeout performed ?Patient Re-evaluated:Patient Re-evaluated prior to induction ?Oxygen Delivery Method: Simple face mask ? ? ? ? ?

## 2021-09-23 NOTE — Discharge Instructions (Signed)
May remove ace wrap 24 hours and wash your arm.. Bruising is normal.   ?

## 2021-09-23 NOTE — Transfer of Care (Signed)
Immediate Anesthesia Transfer of Care Note ? ?Patient: Dorothy Harmon ? ?Procedure(s) Performed: LEFT UPPER EXTREMITY BASILIC SECOND STAGE TRANSPOSITION (Left: Arm Upper) ? ?Patient Location: PACU ? ?Anesthesia Type:MAC and General ? ?Level of Consciousness: awake, alert  and oriented ? ?Airway & Oxygen Therapy: Patient Spontanous Breathing and Patient connected to nasal cannula oxygen ? ?Post-op Assessment: Report given to RN, Post -op Vital signs reviewed and stable and Patient moving all extremities X 4 ? ?Post vital signs: Reviewed and stable ? ?Last Vitals:  ?Vitals Value Taken Time  ?BP 127/55 09/23/21 0921  ?Temp 36.9 ?C 09/23/21 0906  ?Pulse 79 09/23/21 0933  ?Resp 14 09/23/21 0933  ?SpO2 95 % 09/23/21 0933  ?Vitals shown include unvalidated device data. ? ?Last Pain:  ?Vitals:  ? 09/23/21 0906  ?TempSrc:   ?PainSc: 0-No pain  ?   ? ?  ? ?Complications: No notable events documented. ?

## 2021-09-24 ENCOUNTER — Encounter (HOSPITAL_COMMUNITY): Payer: Self-pay | Admitting: Vascular Surgery

## 2021-10-03 ENCOUNTER — Encounter (HOSPITAL_COMMUNITY): Payer: Self-pay

## 2021-10-03 ENCOUNTER — Observation Stay (HOSPITAL_COMMUNITY)
Admission: EM | Admit: 2021-10-03 | Discharge: 2021-10-05 | Disposition: A | Discharge status: Home / Self Care | Payer: Medicare Other | Attending: Internal Medicine | Admitting: Internal Medicine

## 2021-10-03 ENCOUNTER — Other Ambulatory Visit: Payer: Self-pay

## 2021-10-03 DIAGNOSIS — N186 End stage renal disease: Secondary | ICD-10-CM | POA: Diagnosis not present

## 2021-10-03 DIAGNOSIS — N938 Other specified abnormal uterine and vaginal bleeding: Secondary | ICD-10-CM | POA: Diagnosis present

## 2021-10-03 DIAGNOSIS — I5032 Chronic diastolic (congestive) heart failure: Secondary | ICD-10-CM | POA: Insufficient documentation

## 2021-10-03 DIAGNOSIS — Z79899 Other long term (current) drug therapy: Secondary | ICD-10-CM | POA: Insufficient documentation

## 2021-10-03 DIAGNOSIS — I10 Essential (primary) hypertension: Secondary | ICD-10-CM | POA: Diagnosis present

## 2021-10-03 DIAGNOSIS — Z7902 Long term (current) use of antithrombotics/antiplatelets: Secondary | ICD-10-CM | POA: Diagnosis not present

## 2021-10-03 DIAGNOSIS — I132 Hypertensive heart and chronic kidney disease with heart failure and with stage 5 chronic kidney disease, or end stage renal disease: Secondary | ICD-10-CM | POA: Insufficient documentation

## 2021-10-03 DIAGNOSIS — R1900 Intra-abdominal and pelvic swelling, mass and lump, unspecified site: Secondary | ICD-10-CM | POA: Diagnosis present

## 2021-10-03 DIAGNOSIS — N939 Abnormal uterine and vaginal bleeding, unspecified: Secondary | ICD-10-CM | POA: Diagnosis not present

## 2021-10-03 DIAGNOSIS — Z8673 Personal history of transient ischemic attack (TIA), and cerebral infarction without residual deficits: Secondary | ICD-10-CM | POA: Diagnosis not present

## 2021-10-03 DIAGNOSIS — E785 Hyperlipidemia, unspecified: Secondary | ICD-10-CM | POA: Diagnosis not present

## 2021-10-03 DIAGNOSIS — E118 Type 2 diabetes mellitus with unspecified complications: Secondary | ICD-10-CM

## 2021-10-03 DIAGNOSIS — D62 Acute posthemorrhagic anemia: Secondary | ICD-10-CM | POA: Diagnosis present

## 2021-10-03 DIAGNOSIS — N950 Postmenopausal bleeding: Secondary | ICD-10-CM | POA: Insufficient documentation

## 2021-10-03 DIAGNOSIS — E1122 Type 2 diabetes mellitus with diabetic chronic kidney disease: Secondary | ICD-10-CM | POA: Insufficient documentation

## 2021-10-03 DIAGNOSIS — Z992 Dependence on renal dialysis: Secondary | ICD-10-CM | POA: Insufficient documentation

## 2021-10-03 DIAGNOSIS — Z794 Long term (current) use of insulin: Secondary | ICD-10-CM | POA: Insufficient documentation

## 2021-10-03 DIAGNOSIS — D649 Anemia, unspecified: Secondary | ICD-10-CM

## 2021-10-03 DIAGNOSIS — R1909 Other intra-abdominal and pelvic swelling, mass and lump: Secondary | ICD-10-CM | POA: Diagnosis not present

## 2021-10-03 LAB — HEMOGLOBIN AND HEMATOCRIT, BLOOD
HCT: 30.1 % — ABNORMAL LOW (ref 36.0–46.0)
Hemoglobin: 9.9 g/dL — ABNORMAL LOW (ref 12.0–15.0)

## 2021-10-03 LAB — CBC WITH DIFFERENTIAL/PLATELET
Abs Immature Granulocytes: 0.01 10*3/uL (ref 0.00–0.07)
Basophils Absolute: 0.1 10*3/uL (ref 0.0–0.1)
Basophils Relative: 1 %
Eosinophils Absolute: 0.2 10*3/uL (ref 0.0–0.5)
Eosinophils Relative: 3 %
HCT: 28.5 % — ABNORMAL LOW (ref 36.0–46.0)
Hemoglobin: 8.9 g/dL — ABNORMAL LOW (ref 12.0–15.0)
Immature Granulocytes: 0 %
Lymphocytes Relative: 26 %
Lymphs Abs: 1.5 10*3/uL (ref 0.7–4.0)
MCH: 30.5 pg (ref 26.0–34.0)
MCHC: 31.2 g/dL (ref 30.0–36.0)
MCV: 97.6 fL (ref 80.0–100.0)
Monocytes Absolute: 0.4 10*3/uL (ref 0.1–1.0)
Monocytes Relative: 7 %
Neutro Abs: 3.6 10*3/uL (ref 1.7–7.7)
Neutrophils Relative %: 63 %
Platelets: 286 10*3/uL (ref 150–400)
RBC: 2.92 MIL/uL — ABNORMAL LOW (ref 3.87–5.11)
RDW: 13.2 % (ref 11.5–15.5)
WBC: 5.7 10*3/uL (ref 4.0–10.5)
nRBC: 0 % (ref 0.0–0.2)

## 2021-10-03 LAB — COMPREHENSIVE METABOLIC PANEL
ALT: 23 U/L (ref 0–44)
AST: 21 U/L (ref 15–41)
Albumin: 3 g/dL — ABNORMAL LOW (ref 3.5–5.0)
Alkaline Phosphatase: 80 U/L (ref 38–126)
Anion gap: 14 (ref 5–15)
BUN: 45 mg/dL — ABNORMAL HIGH (ref 8–23)
CO2: 24 mmol/L (ref 22–32)
Calcium: 8.8 mg/dL — ABNORMAL LOW (ref 8.9–10.3)
Chloride: 101 mmol/L (ref 98–111)
Creatinine, Ser: 3.87 mg/dL — ABNORMAL HIGH (ref 0.44–1.00)
GFR, Estimated: 12 mL/min — ABNORMAL LOW (ref >60)
Glucose, Bld: 138 mg/dL — ABNORMAL HIGH (ref 70–99)
Potassium: 3.5 mmol/L (ref 3.5–5.1)
Sodium: 139 mmol/L (ref 135–145)
Total Bilirubin: 0.6 mg/dL (ref 0.3–1.2)
Total Protein: 6.2 g/dL — ABNORMAL LOW (ref 6.5–8.1)

## 2021-10-03 LAB — HEPATITIS B SURFACE ANTIGEN: Hepatitis B Surface Ag: NONREACTIVE

## 2021-10-03 LAB — HEMOGLOBIN A1C
Hgb A1c MFr Bld: 5.3 % (ref 4.8–5.6)
Mean Plasma Glucose: 105.41 mg/dL

## 2021-10-03 LAB — CBG MONITORING, ED: Glucose-Capillary: 122 mg/dL — ABNORMAL HIGH (ref 70–99)

## 2021-10-03 LAB — HIV ANTIBODY (ROUTINE TESTING W REFLEX): HIV Screen 4th Generation wRfx: NONREACTIVE

## 2021-10-03 LAB — PROTIME-INR
INR: 1.1 (ref 0.8–1.2)
Prothrombin Time: 14 seconds (ref 11.4–15.2)

## 2021-10-03 LAB — ABO/RH: ABO/RH(D): A POS

## 2021-10-03 LAB — HEPATITIS B SURFACE ANTIBODY,QUALITATIVE: Hep B S Ab: NONREACTIVE

## 2021-10-03 MED ORDER — SODIUM CHLORIDE 0.9% FLUSH
3.0000 mL | Freq: Two times a day (BID) | INTRAVENOUS | Status: DC
  Administered 2021-10-03: 3 mL via INTRAVENOUS

## 2021-10-03 MED ORDER — ACETAMINOPHEN 650 MG RE SUPP: 650.0000 mg | Freq: Four times a day (QID) | RECTAL | Status: DC | PRN

## 2021-10-03 MED ORDER — MEGESTROL ACETATE 40 MG PO TABS
40.0000 mg | ORAL_TABLET | Freq: Two times a day (BID) | ORAL | Status: DC
  Administered 2021-10-03 – 2021-10-05 (×4): 40 mg via ORAL
  Filled 2021-10-03 (×5): qty 1

## 2021-10-03 MED ORDER — INSULIN GLARGINE-YFGN 100 UNIT/ML ~~LOC~~ SOLN
20.0000 [IU] | Freq: Every day | SUBCUTANEOUS | Status: DC
  Administered 2021-10-03 – 2021-10-04 (×2): 20 [IU] via SUBCUTANEOUS
  Filled 2021-10-03 (×3): qty 0.2

## 2021-10-03 MED ORDER — CHLORHEXIDINE GLUCONATE CLOTH 2 % EX PADS: 6.0000 | MEDICATED_PAD | Freq: Every day | CUTANEOUS | Status: DC

## 2021-10-03 MED ORDER — ACETAMINOPHEN 325 MG PO TABS
650.0000 mg | ORAL_TABLET | Freq: Four times a day (QID) | ORAL | Status: DC | PRN
  Administered 2021-10-05: 650 mg via ORAL
  Filled 2021-10-03: qty 2

## 2021-10-03 MED ORDER — INSULIN ASPART 100 UNIT/ML IJ SOLN
0.0000 [IU] | Freq: Three times a day (TID) | INTRAMUSCULAR | Status: DC
  Administered 2021-10-03: 1 [IU] via SUBCUTANEOUS
  Administered 2021-10-05: 2 [IU] via SUBCUTANEOUS

## 2021-10-03 MED ORDER — ROSUVASTATIN CALCIUM 20 MG PO TABS
20.0000 mg | ORAL_TABLET | Freq: Every evening | ORAL | Status: DC
  Administered 2021-10-03 – 2021-10-04 (×2): 20 mg via ORAL
  Filled 2021-10-03 (×2): qty 1

## 2021-10-03 MED ORDER — MEGESTROL ACETATE 40 MG PO TABS: 40.0000 mg | ORAL_TABLET | Freq: Every day | ORAL | Status: DC

## 2021-10-03 MED ORDER — SODIUM CHLORIDE 0.9 % IV SOLN
10.0000 mL/h | Freq: Once | INTRAVENOUS | Status: AC
Start: 2021-10-03 — End: 2021-10-03
  Administered 2021-10-03: 10 mL/h via INTRAVENOUS

## 2021-10-03 NOTE — ED Notes (Signed)
RN changed and cleaned IV site. ?

## 2021-10-03 NOTE — ED Triage Notes (Signed)
Pt bib ems from home c/o vaginal bleeding with intermittent abdominal pain that has been going on since Friday. Pt endorses changing pads hourly with visible clots. Pt has a ovarian cyst that was suppose to be removed but procedure has been prolonged. Pt is a dialysis pt that completes tx regularly.  ? ? ?BP 140/70 ?HR 90 ?RR 20 ?RA 100% ?CBG 147 ?

## 2021-10-03 NOTE — Consult Note (Addendum)
Gynecology Consult Note ? ?Date of Consult: 10/03/2021  ? ?Requesting Provider: [facility] (Dr. Doren Custard) ? ?Primary OBGYN: Dr. Rip Harbour [facility]) ?Primary Care Provider: Haywood Pao ? ?Reason for Consult: anemia. Acute on chronic postmenopausal bleeding ? ?History of Present Illness: Dorothy Harmon is a 71 y.o. (No LMP recorded. Patient is postmenopausal.), with the above CC. PMHx is significant for large right adnexal mass, ESRD with fistula, DM2, CHF, HTN, h/o CVA ? ?Patient seen by Dr. Rip Harbour on 03/10/21 for new patient exam for thickened endometrium on u/s done after renal u/s showed abnormal uterine findings and subsequent gyn u/s confirmed (see below). Pap negative and plan for repeat gyn u/s and for the OR. ? ?At that time she denied any PMB. Patient seen for f/u with Dr. Rip Harbour on 09/09/21 due to original OR being cancelled due to patient personal issues; VB was noted at this visit and patient set up for OR in May. ? ?Patient states her VB is nearly qday but not heavy and only needing maybe a pad qday (non saturated) and with minimal pain. Her VB increased on Friday and she felt weak and tired and she was continuing to having increased VB with clots so she came to the ED ? ?Hgb  8.9 at 1230pm from her baseline of 10-11. GYN consulted. No endometrial biopsy or tumor markers ever done.  ? ?ROS: A 12-point review of systems was performed and negative, except as stated in the above HPI. ? ?OBGYN History: As per HPI. ?  ?Past Medical History: ?Past Medical History:  ?Diagnosis Date  ? Anticoagulant long-term use   ? plavix--- managed by pcp, dr Osborne Casco  ? Chronic diastolic CHF (congestive heart failure) ([place])   ? last echo in epic 03-04-2021  ef 55%, G1DD, mild TR  ? CKD (chronic kidney disease), stage IV ([place])   ? nephrologist-- dr Johnney Ou, on dialysis  ? Fibromyalgia   ? History of CVA (cerebrovascular accident) 04/21/2013  ? 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 History of kidney stones   ? History of seizure 12/2006  ? h/o 1 seizure   ? Hypercholesteremia   ? Hypertension   ? Hypertensive retinopathy of both eyes   ? IBS (irritable bowel syndrome)   ? MDD (major depressive disorder)   ? OA (osteoarthritis)   ? OSA (obstructive sleep apnea)   ? per pt had sleep many yrs ago, intolerant to cpap  ? Peripheral neuropathy   ? Stroke [facility])   ? Thickened endometrium   ? Type 2 diabetes mellitus treated with insulin ([place])   ? followed by pcp---  (03-19-2021 per pt checks daily in am,  fasting sugar-- 90s)  ? ? ?Past Surgical History: ?Past Surgical History:  ?Procedure Laterality Date  ? AV FISTULA PLACEMENT Left 07/06/2021  ? Procedure: LEFT FIRST STAGE BRACHIOBASILIC FISTULA CREATION;  Surgeon: Broadus John, MD;  Location: [facility] OR;  Service: Vascular;  Laterality: Left;  PERIPHERAL NERVE BLOCK  ? BASCILIC VEIN TRANSPOSITION Left 09/23/2021  ? Procedure: LEFT UPPER EXTREMITY BASILIC SECOND STAGE TRANSPOSITION;  Surgeon: Broadus John, MD;  Location: [facility] OR;  Service: Vascular;  Laterality: Left;  PERIPHERAL NERVE BLOCK  ? CARDIAC CATHETERIZATION  12/01/2003  ? @[facility]  by dr [place];  normal coronaries, normal lvf, ef 65%, mild to moderate pulm htn (cath done for positive cardiolite)  ? LAPAROSCOPIC CHOLECYSTECTOMY  04/18/2002  ? @[facility]   ? RHINOPLASTY  1974  ? TUBAL LIGATION Bilateral   ? yrs ago  ? WRIST FRACTURE SURGERY  1992  ? ? ?Family History:  ?Family History  ?Problem Relation Age of Onset  ? Heart attack Father   ? Diabetes Mother   ?     alive, 40yo (2014)  ? Hypertension Mother   ? ? ?Social History:  ?Social History  ? ?Socioeconomic History  ? Marital status: Divorced  ?  Spouse name: Not on file  ? Number of children: 2  ? Years of education: 62  ? Highest education level: Not on file  ?Occupational History  ? Occupation: customer service  ?  Comment: [employer]  ?Tobacco Use  ? Smoking status: Never  ? Smokeless tobacco: Never  ?Vaping  Use  ? Vaping Use: Never used  ?Substance and Sexual Activity  ? Alcohol use: No  ? Drug use: No  ? Sexual activity: Not Currently  ?  Birth control/protection: Post-menopausal  ?Other Topics Concern  ? Not on file  ?Social History Narrative  ? Mother lives with her; divorced  ? Retired   ? Education  ? Right handed.  ? Caffeine- None   ? ?Social Determinants of Health  ? ?Financial Resource Strain: Not on file  ?Food Insecurity: Not on file  ?Transportation Needs: Not on file  ?Physical Activity: Not on file  ?Stress: Not on file  ?Social Connections: Not on file  ?Intimate Partner Violence: Not on file  ? ?Allergy: ?Allergies  ?Allergen Reactions  ? Ace Inhibitors Other (See Comments)  ?  cough  ? Codeine Itching and Swelling  ? Morphine And Related Other (See Comments)  ?  Loss of consciousness, feeling sick, faint  ? Penicillins Itching and Swelling  ?  Did it involve swelling of the face/tongue/throat, SOB, or low BP? N ?Did it involve sudden or severe rash/hives, skin peeling, or any reaction on the inside of your mouth or nose? N ?Did you need to seek medical attention at a hospital or doctor's office? N ?When did it last happen?   45 years ago    ?If all above answers are "NO", may proceed with cephalosporin use. ?  ? ? ? ? ? ?Medications: ?Current Facility-Administered Medications  ?Medication Dose Route Frequency Provider Last Rate Last Admin  ? 0.9 %  sodium chloride infusion  10 mL/hr Intravenous Once Godfrey Pick, MD      ? megestrol (MEGACE) tablet 40 mg  40 mg Oral BID Godfrey Pick, MD      ? ?Current Outpatient Medications  ?Medication Sig Dispense Refill  ? acetaminophen (TYLENOL) 500 MG tablet Take 1,000 mg by mouth every 6 (six) hours as needed for moderate pain or mild pain.    ? amLODipine (NORVASC) 5 MG tablet Take 1 tablet (5 mg total) by mouth daily. (Patient not taking: Reported on 09/16/2021) 90 tablet 2  ? clopidogrel (PLAVIX) 75 MG tablet Take 1 tablet (75 mg total) by mouth daily with  breakfast. (Patient taking differently: Take 75 mg by mouth every evening.) 30 tablet 2  ? insulin glargine, 2 Unit Dial, (TOUJEO MAX SOLOSTAR) 300 UNIT/ML Solostar Pen Inject 34 Units into the skin at bedtime.    ? oxyCODONE-acetaminophen (PERCOCET/ROXICET) 5-325 MG tablet Take 1 tablet by mouth every 6 (six) hours as needed. 12 tablet 0  ? rosuvastatin (CRESTOR) 20 MG tablet Take 1 tablet (20 mg total) by mouth daily. (Patient taking differently: Take 20 mg by mouth every evening.) 90 tablet 2  ? ? ? ?  Physical Exam: ? Current Vital Signs 24h Vital Sign Ranges  ?T 98.1 ?F (36.7 ?C) Temp  Avg: 98.1 ?F (36.7 ?C)  Min: 98.1 ?F (36.7 ?C)  Max: 98.1 ?F (36.7 ?C)  ?BP (!) 113/52 ? BP  Min: 113/52  Max: 143/67  ?HR 81 Pulse  Avg: 87  Min: 81  Max: 91  ?RR 19 Resp  Avg: 15  Min: 11  Max: 19  ?SaO2 100 % Room Air SpO2  Avg: 100 %  Min: 100 %  Max: 100 %  ?    ? 24 Hour I/O Current Shift I/O  ?Time ?Ins ?Outs No intake/output data recorded. No intake/output data recorded.  ? ?Patient Vitals for the past 24 hrs: ? BP Temp Temp src Pulse Resp SpO2 Height Weight  ?10/03/21 1331 (!) 113/52 -- -- 81 19 100 % -- --  ?10/03/21 1232 -- -- -- -- -- -- 5\' 4"  (1.626 m) 77.1 kg  ?10/03/21 1217 -- -- -- -- -- 100 % -- --  ?10/03/21 1215 (!) 133/58 -- -- 89 11 100 % -- --  ?10/03/21 1213 (!) 143/67 98.1 ?F (36.7 ?C) Oral 91 15 100 % -- --  ? ? ?Body mass index is 29.18 kg/m?. ?General appearance: Well nourished, well developed female in no acute distress.  ?Cardiovascular: S1, S2 normal, no murmur, rub or gallop, regular rate and rhythm ?Respiratory:  Clear to auscultation bilateral. Normal respiratory effort ?Abdomen: positive bowel sounds and no masses, hernias; diffusely non tender to palpation, non distended ?Neuro/Psych:  Normal mood and affect.  ?Skin:  Warm and dry.  ?Extremities: no clubbing, cyanosis, or edema.  ?Lymphatic:  No inguinal lymphadenopathy.  ? ?Pelvic exam:  ?EGBUS with no blood or bleeding. Patient has pad that is  close to saturation with old blood. Patient states she's had it on for several hours. No active VB ? ?Laboratory: ?Recent Labs  ?Lab 10/03/21 ?1237  ?WBC 5.7  ?HGB 8.9*  ?HCT 28.5*  ?PLT 286  ? ?Recent Labs  ?Lab

## 2021-10-03 NOTE — ED Provider Notes (Signed)
?[facility] ?Provider Note ? ? ?CSN: [id number] ?Arrival date & time: 10/03/21  1205 ? ?  ? ?History ? ?Chief Complaint  ?Patient presents with  ? Vaginal Bleeding  ? ? ?Dorothy Harmon is a 71 y.o. female. ? ? ?Vaginal Bleeding ?Associated symptoms: dizziness and fatigue   ?Patient presents for persistent vaginal bleeding.  This has been ongoing for the past 2 days.  She has been changing pads hourly and has had presence of clots.  Medical history includes CVA, DM, HTN, HLD, obesity, and ESRD.  She is on Plavix.  She has a scheduled D&C in 3 weeks.  Per chart review, she was seen on 3/23 by gynecology (Dr. Rip Harbour) for vaginal bleeding x2 months.  Patient reports that previously this bleeding consisted only of spotting.  For the past 2 days, volume of blood loss has been more severe.  She did undergo dialysis on Friday.  Since worsened bleeding, she has felt fatigue, generalized weakness, and has had some dizziness with standing.  Patient currently denies any abdominal pain or cramping. ?  ? ?Home Medications ?Prior to Admission medications   ?Medication Sig Start Date End Date Taking? Authorizing Provider  ?acetaminophen (TYLENOL) 500 MG tablet Take 1,000 mg by mouth every 6 (six) hours as needed for moderate pain or mild pain.   Yes [provider]  ?clopidogrel (PLAVIX) 75 MG tablet Take 1 tablet (75 mg total) by mouth daily with breakfast. ?Patient taking differently: Take 75 mg by mouth every evening. 04/24/13  Yes Effie Berkshire, MD  ?insulin glargine, 2 Unit Dial, (TOUJEO MAX SOLOSTAR) 300 UNIT/ML Solostar Pen Inject 34 Units into the skin at bedtime.   Yes [provider]  ?rosuvastatin (CRESTOR) 20 MG tablet Take 1 tablet (20 mg total) by mouth daily. ?Patient taking differently: Take 20 mg by mouth every evening. 08/24/20  Yes Patwardhan, Reynold Bowen, MD  ?   ? ?Allergies    ?Ace inhibitors, Codeine, Morphine and related, and Penicillins   ? ?Review of  Systems   ?Review of Systems  ?Constitutional:  Positive for fatigue.  ?Genitourinary:  Positive for vaginal bleeding.  ?Neurological:  Positive for dizziness, weakness (Generalized) and light-headedness.  ?All other systems reviewed and are negative. ? ?Physical Exam ?Updated Vital Signs ?BP 120/68   Pulse 78   Temp 98.2 ?F (36.8 ?C) (Oral)   Resp 20   Ht 5\' 4"  (1.626 m)   Wt 77.1 kg   SpO2 98%   BMI 29.18 kg/m?  ?Physical Exam ?Vitals and nursing note reviewed.  ?Constitutional:   ?   General: She is not in acute distress. ?   Appearance: Normal appearance. She is well-developed. She is not ill-appearing, toxic-appearing or diaphoretic.  ?HENT:  ?   Head: Normocephalic and atraumatic.  ?   Right Ear: External ear normal.  ?   Left Ear: External ear normal.  ?   Nose: Nose normal.  ?   Mouth/Throat:  ?   Mouth: Mucous membranes are moist.  ?   Pharynx: Oropharynx is clear.  ?Eyes:  ?   Extraocular Movements: Extraocular movements intact.  ?   Conjunctiva/sclera: Conjunctivae normal.  ?Cardiovascular:  ?   Rate and Rhythm: Normal rate and regular rhythm.  ?   Heart sounds: No murmur heard. ?Pulmonary:  ?   Effort: Pulmonary effort is normal. No respiratory distress.  ?Abdominal:  ?   Palpations: Abdomen is soft.  ?   Tenderness: There is  no abdominal tenderness.  ?Musculoskeletal:     ?   General: No swelling. Normal range of motion.  ?   Cervical back: Normal range of motion and neck supple.  ?Skin: ?   General: Skin is warm and dry.  ?   Capillary Refill: Capillary refill takes less than 2 seconds.  ?   Coloration: Skin is not jaundiced.  ?Neurological:  ?   General: No focal deficit present.  ?   Mental Status: She is alert and oriented to person, place, and time.  ?   Cranial Nerves: No cranial nerve deficit.  ?   Sensory: No sensory deficit.  ?   Motor: No weakness.  ?   Coordination: Coordination normal.  ?Psychiatric:     ?   Mood and Affect: Mood normal.     ?   Behavior: Behavior normal.     ?    Thought Content: Thought content normal.     ?   Judgment: Judgment normal.  ? ? ?ED Results / Procedures / Treatments   ?Labs ?(all labs ordered are listed, but only abnormal results are displayed) ?Labs Reviewed  ?CBC WITH DIFFERENTIAL/PLATELET - Abnormal; Notable for the following components:  ?    Result Value  ? RBC 2.92 (*)   ? Hemoglobin 8.9 (*)   ? HCT 28.5 (*)   ? All other components within normal limits  ?COMPREHENSIVE METABOLIC PANEL - Abnormal; Notable for the following components:  ? Glucose, Bld 138 (*)   ? BUN 45 (*)   ? Creatinine, Ser 3.87 (*)   ? Calcium 8.8 (*)   ? Total Protein 6.2 (*)   ? Albumin 3.0 (*)   ? GFR, Estimated 12 (*)   ? All other components within normal limits  ?HEMOGLOBIN AND HEMATOCRIT, BLOOD - Abnormal; Notable for the following components:  ? Hemoglobin 9.9 (*)   ? HCT 30.1 (*)   ? All other components within normal limits  ?CBC - Abnormal; Notable for the following components:  ? RBC 3.00 (*)   ? Hemoglobin 9.4 (*)   ? HCT 27.9 (*)   ? All other components within normal limits  ?RENAL FUNCTION PANEL - Abnormal; Notable for the following components:  ? Glucose, Bld 156 (*)   ? BUN 65 (*)   ? Creatinine, Ser 4.47 (*)   ? Calcium 8.5 (*)   ? Albumin 2.8 (*)   ? GFR, Estimated 10 (*)   ? All other components within normal limits  ?CBG MONITORING, ED - Abnormal; Notable for the following components:  ? Glucose-Capillary 122 (*)   ? All other components within normal limits  ?PROTIME-INR  ?HEMOGLOBIN A1C  ?HIV ANTIBODY (ROUTINE TESTING W REFLEX)  ?HEPATITIS B SURFACE ANTIGEN  ?HEPATITIS B SURFACE ANTIBODY,QUALITATIVE  ?CA 125  ?CEA  ?CANCER ANTIGEN 19-9  ?HEPATITIS B SURFACE ANTIBODY, QUANTITATIVE  ?TYPE AND SCREEN  ?ABO/RH  ?PREPARE RBC (CROSSMATCH)  ?PREPARE RBC (CROSSMATCH)  ? ? ?EKG ?None ? ?Radiology ?No results found. ? ?Procedures ?Procedures  ? ? ?Medications Ordered in ED ?Medications  ?megestrol (MEGACE) tablet 40 mg (40 mg Oral Given 10/03/21 2155)  ?rosuvastatin  (CRESTOR) tablet 20 mg (20 mg Oral Given 10/03/21 1723)  ?sodium chloride flush (NS) 0.9 % injection 3 mL (3 mLs Intravenous Given 10/03/21 2155)  ?acetaminophen (TYLENOL) tablet 650 mg (has no administration in time range)  ?  Or  ?acetaminophen (TYLENOL) suppository 650 mg (has no administration in time range)  ?insulin glargine-yfgn (SEMGLEE)  injection 20 Units (20 Units Subcutaneous Given 10/03/21 2226)  ?insulin aspart (novoLOG) injection 0-9 Units (1 Units Subcutaneous Given 10/03/21 1639)  ?Chlorhexidine Gluconate Cloth 2 % PADS 6 each (0 each Topical Hold 10/04/21 0503)  ?0.9 %  sodium chloride infusion (0 mL/hr Intravenous Stopped 10/03/21 1455)  ? ? ?ED Course/ Medical Decision Making/ A&P ?  ?                        ?Medical Decision Making ?Amount and/or Complexity of Data Reviewed ?Labs: ordered. ? ?Risk ?Prescription drug management. ?Decision regarding hospitalization. ? ? ?This patient presents to the ED for concern of vaginal bleeding, this involves an extensive number of treatment options, and is a complaint that carries with it a high risk of complications and morbidity.  The differential diagnosis includes abnormal uterine bleeding, symptomatic anemia ? ? ?Co morbidities that complicate the patient evaluation ? ?CVA, DM, HTN, HLD, obesity, and ESRD ? ? ?Additional history obtained: ? ?Additional history obtained from N/A ?External records from outside source obtained and reviewed including EMR ? ? ?Lab Tests: ? ?I Ordered, and personally interpreted labs.  The pertinent results include: 1.6 g drop in hemoglobin over the past 10 days ? ? ?Cardiac Monitoring: / EKG: ? ?The patient was maintained on a cardiac monitor.  I personally viewed and interpreted the cardiac monitored which showed an underlying rhythm of: Sinus rhythm ? ? ?Consultations Obtained: ? ?I requested consultation with the gynecologist,  and discussed lab and imaging findings as well as pertinent plan - they recommend: RBC transfusion,  initiation of Megace, admission to medicine ? ? ?Problem List / ED Course / Critical interventions / Medication management ? ?Patient is a 71 year old female who presents for worsening abnormal uterine bleeding.  She has ha

## 2021-10-03 NOTE — ED Notes (Signed)
RN tossed pt scrub pants and underwear in trash per pt request d/t blood stains. RN provided pericare and brief. ? ?

## 2021-10-03 NOTE — ED Notes (Signed)
Pt IV site leaking blood d/t skin slight loose around site. RN cleaned and changed dressing. ?

## 2021-10-03 NOTE — ED Notes (Signed)
CBG- 182 

## 2021-10-03 NOTE — H&P (Signed)
?History and Physical  ? ? ?Patient: Dorothy Harmon EXN:[id number] DOB: 11/08/1950 ?DOA: 10/03/2021 ?DOS: the patient was seen and examined on 10/03/2021 ?PCP: Haywood Pao, MD  ?Patient coming from: Home ? ?Chief Complaint:  ?Chief Complaint  ?Patient presents with  ? Vaginal Bleeding  ? ?HPI: Dorothy KOCOUREK is a 71 y.o. 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  She has had intermittent vaginal bleeding since starting hemodialysis about 6 weeks ago.  She first noticed that she was having vaginal bleeding again due to blood being on the seat after she had completed her hemodialysis session 2 days ago.  Thereafter she reported later that night and the following day passing huge blood clots.  Patient reports bleeding has been steady and when she woke up this morning felt weak and lightheaded.  She tried eating breakfast but reported no improvement in symptoms.  She chronically has some shortness of breath and states that it is at baseline. Records note prior CT of abdomen revealed 6 cm right adnexal mass that was unchanged in size from 01/07/2021 on 08/10/2021 and thought to possibly reflect a ovarian mass or uterine fibroid with endometrial mass noted on prior ultrasound.  She is on Plavix and statin after having stroke back in 2014.  Underwent left upper extremity basilic second stage transposition on 4/6 with Dr. Unk Lightning vascular surgery. ? ?On admission into the emergency department patient was noted to have relatively stable vital signs.  Labs significant for hemoglobin 8.9, potassium 3.5, BUN 45, creatinine 3.87, and glucose 138.  OB/GYN has been consulted recommended medicine admission.  Patient had been typed and screened for blood products and initially ordered to be transfused 2 units of packed red blood cells.  Discussed with ED provider and  recommended only transfusing 1 unit given patient is on dialysis. ? ? ?Review of Systems: As mentioned in the history of present illness. All other systems reviewed and are negative. ?Past Medical History:  ?Diagnosis Date  ? Anticoagulant long-term use   ? plavix--- managed by pcp, dr Osborne Casco  ? Chronic diastolic CHF (congestive heart failure) ([place])   ? last echo in epic 03-04-2021  ef 55%, G1DD, mild TR  ? CKD (chronic kidney disease), stage IV ([place])   ? nephrologist-- dr Johnney Ou, on dialysis  ? Fibromyalgia   ? History of CVA (cerebrovascular accident) 04/21/2013  ? hostpiral admission -- acute left thalamus infarct and imaging showed previous old small vessel infart right cerebullum  ? History of kidney stones   ? History of seizure 12/2006  ? h/o 1 seizure   ? Hypercholesteremia   ? Hypertension   ? Hypertensive retinopathy of both eyes   ? IBS (irritable bowel syndrome)   ? MDD (major depressive disorder)   ? OA (osteoarthritis)   ? OSA (obstructive sleep apnea)   ? per pt had sleep many yrs ago, intolerant to cpap  ? Peripheral neuropathy   ? Stroke [facility])   ? Thickened endometrium   ? Type 2 diabetes mellitus treated with insulin ([place])   ? followed by pcp---  (03-19-2021 per pt checks daily in am,  fasting sugar-- 90s)  ? ?Past Surgical History:  ?Procedure Laterality Date  ? AV FISTULA PLACEMENT Left 07/06/2021  ? Procedure: LEFT FIRST STAGE BRACHIOBASILIC FISTULA CREATION;  Surgeon: Broadus John, MD;  Location: [place];  Service: Vascular;  Laterality: Left;  PERIPHERAL NERVE BLOCK  ? BASCILIC VEIN TRANSPOSITION Left 09/23/2021  ? Procedure: LEFT UPPER EXTREMITY BASILIC SECOND STAGE TRANSPOSITION;  Surgeon: Broadus John, MD;  Location: [facility] OR;  Service: Vascular;  Laterality: Left;  PERIPHERAL NERVE BLOCK  ? CARDIAC CATHETERIZATION  12/01/2003  ? @[facility]  by dr Martinique;  normal coronaries, normal lvf, ef 65%, mild to moderate pulm htn (cath done for positive cardiolite)  ? LAPAROSCOPIC CHOLECYSTECTOMY   04/18/2002  ? @[facility]   ? RHINOPLASTY  1974  ? TUBAL LIGATION Bilateral   ? yrs ago  ? WRIST FRACTURE SURGERY  1992  ? ?Social History:  reports that she has never smoked. She has never used smokeless tobacco. She reports that she does not drink alcohol and does not use drugs. ? ?Allergies  ?Allergen Reactions  ? Ace Inhibitors Other (See Comments)  ?  cough  ? Codeine Itching and Swelling  ? Morphine And Related Other (See Comments)  ?  Loss of consciousness, feeling sick, faint  ? Penicillins Itching and Swelling  ?  Did it involve swelling of the face/tongue/throat, SOB, or low BP? N ?Did it involve sudden or severe rash/hives, skin peeling, or any reaction on the inside of your mouth or nose? N ?Did you need to seek medical attention at a hospital or doctor's office? N ?When did it last happen?   45 years ago    ?If all above answers are "NO", may proceed with cephalosporin use. ?  ? ? ?Family History  ?Problem Relation Age of Onset  ? Heart attack Father   ? Diabetes Mother   ?     alive, 67yo (2014)  ? Hypertension Mother   ? ? ?Prior to Admission medications   ?Medication Sig Start Date End Date Taking? Authorizing Provider  ?acetaminophen (TYLENOL) 500 MG tablet Take 1,000 mg by mouth every 6 (six) hours as needed for moderate pain or mild pain.    [provider]  ?amLODipine (NORVASC) 5 MG tablet Take 1 tablet (5 mg total) by mouth daily. ?Patient not taking: Reported on 09/16/2021 08/24/20   Nigel Mormon, MD  ?clopidogrel (PLAVIX) 75 MG tablet Take 1 tablet (75 mg total) by mouth daily with breakfast. ?Patient taking differently: Take 75 mg by mouth every evening. 04/24/13   Effie Berkshire, MD  ?insulin glargine, 2 Unit Dial, (TOUJEO MAX SOLOSTAR) 300 UNIT/ML Solostar Pen Inject 34 Units into the skin at bedtime.    [provider]  ?oxyCODONE-acetaminophen (PERCOCET/ROXICET) 5-325 MG tablet Take 1 tablet by mouth every 6 (six) hours as needed. 09/23/21   Ulyses Amor, PA-C   ?rosuvastatin (CRESTOR) 20 MG tablet Take 1 tablet (20 mg total) by mouth daily. ?Patient taking differently: Take 20 mg by mouth every evening. 08/24/20   Patwardhan, Reynold Bowen, MD  ? ? ?Physical Exam: ?Vitals:  ? 10/03/21 1215 10/03/21 1217 10/03/21 1232 10/03/21 1331  ?BP: (!) 133/58   (!) 113/52  ?Pulse: 89   81  ?Resp: 11   19  ?Temp:      ?TempSrc:      ?SpO2: 100% 100%  100%  ?Weight:   77.1 kg   ?Height:   5\' 4"  (1.626 m)   ? ?Exam ? ?Constitutional: Elderly female currently in no acute distress ?Eyes: PERRL, lids and conjunctivae normal ?ENMT: Mucous membranes are moist. Posterior pharynx clear of any exudate or lesions.  ?Neck: normal, supple, no masses, no thyromegaly ?Respiratory: clear to auscultation bilaterally, no wheezing, no  crackles. Normal respiratory effort. No accessory muscle use.  ?Cardiovascular: Regular rate and rhythm, no murmurs / rubs / gallops.  hemodialysis catheter of the right chest wall.  Surgical wounds of fistula of the left upper extremity.  Healing without significant signs of erythema. ?Abdomen: no tenderness,   Bowel sounds positive.  ?Musculoskeletal: no clubbing / cyanosis. No joint deformity upper and lower extremities.   ?Skin: no rashes, lesions, ulcers. No induration ?Neurologic: CN 2-12 grossly intact.  Able to move all extremities. ?Psychiatric: Normal judgment and insight. Alert and oriented x 3. Normal mood.  ? ?Data Reviewed: ? ?Reviewed imaging, labs, and pertinent records as noted above in HPI ? ?Assessment and Plan: ?Acute blood loss anemia secondary to vaginal bleeding ?Patient presents with complaints of vaginal bleeding.  Hemoglobin previously had been around 10.5 on 4/6, but presents with hemoglobin 8.9.  OB/GYN has been consulted and recommended patient be started on Megace.  Patient has been typed and screened and was recommended initially 2 units of packed red blood cells.  ?-Admit to a medical telemetry bed ?-Change order to transfuse 1 unit of packed red  blood cells given ESRD ?-Serial monitoring of H&H ?-Continue to monitor and transfuse additional packed red blood cells as needed for hemoglobins less than 8 g/dL ?-Continue Megace ?-Follow-up labs including CEA, CA 19-9

## 2021-10-04 DIAGNOSIS — D62 Acute posthemorrhagic anemia: Secondary | ICD-10-CM | POA: Diagnosis not present

## 2021-10-04 DIAGNOSIS — E785 Hyperlipidemia, unspecified: Secondary | ICD-10-CM | POA: Diagnosis not present

## 2021-10-04 DIAGNOSIS — N939 Abnormal uterine and vaginal bleeding, unspecified: Secondary | ICD-10-CM | POA: Diagnosis not present

## 2021-10-04 DIAGNOSIS — I10 Essential (primary) hypertension: Secondary | ICD-10-CM | POA: Diagnosis not present

## 2021-10-04 LAB — RENAL FUNCTION PANEL
Albumin: 2.8 g/dL — ABNORMAL LOW (ref 3.5–5.0)
Anion gap: 8 (ref 5–15)
BUN: 65 mg/dL — ABNORMAL HIGH (ref 8–23)
CO2: 28 mmol/L (ref 22–32)
Calcium: 8.5 mg/dL — ABNORMAL LOW (ref 8.9–10.3)
Chloride: 103 mmol/L (ref 98–111)
Creatinine, Ser: 4.47 mg/dL — ABNORMAL HIGH (ref 0.44–1.00)
GFR, Estimated: 10 mL/min — ABNORMAL LOW (ref >60)
Glucose, Bld: 156 mg/dL — ABNORMAL HIGH (ref 70–99)
Phosphorus: 4.5 mg/dL (ref 2.5–4.6)
Potassium: 4 mmol/L (ref 3.5–5.1)
Sodium: 139 mmol/L (ref 135–145)

## 2021-10-04 LAB — TYPE AND SCREEN
ABO/RH(D): A POS
Antibody Screen: NEGATIVE
Unit division: 0

## 2021-10-04 LAB — BPAM RBC
Blood Product Expiration Date: 202305062359
ISSUE DATE / TIME: 202304161446
Unit Type and Rh: 6200

## 2021-10-04 LAB — PREPARE RBC (CROSSMATCH)

## 2021-10-04 LAB — CBC
HCT: 27.9 % — ABNORMAL LOW (ref 36.0–46.0)
Hemoglobin: 9.4 g/dL — ABNORMAL LOW (ref 12.0–15.0)
MCH: 31.3 pg (ref 26.0–34.0)
MCHC: 33.7 g/dL (ref 30.0–36.0)
MCV: 93 fL (ref 80.0–100.0)
Platelets: 275 10*3/uL (ref 150–400)
RBC: 3 MIL/uL — ABNORMAL LOW (ref 3.87–5.11)
RDW: 13.9 % (ref 11.5–15.5)
WBC: 7 10*3/uL (ref 4.0–10.5)
nRBC: 0 % (ref 0.0–0.2)

## 2021-10-04 LAB — GLUCOSE, CAPILLARY
Glucose-Capillary: 182 mg/dL — ABNORMAL HIGH (ref 70–99)
Glucose-Capillary: 118 mg/dL — ABNORMAL HIGH (ref 70–99)
Glucose-Capillary: 173 mg/dL — ABNORMAL HIGH (ref 70–99)
Glucose-Capillary: 210 mg/dL — ABNORMAL HIGH (ref 70–99)

## 2021-10-04 MED ORDER — CLOPIDOGREL BISULFATE 75 MG PO TABS: 75.0000 mg | ORAL_TABLET | Freq: Every evening | ORAL | Status: AC

## 2021-10-04 MED ORDER — PROSOURCE PLUS PO LIQD
30.0000 mL | Freq: Two times a day (BID) | ORAL | Status: DC
  Administered 2021-10-05: 30 mL via ORAL
  Filled 2021-10-04 (×2): qty 30

## 2021-10-04 MED ORDER — MEGESTROL ACETATE 40 MG PO TABS: 40.0000 mg | ORAL_TABLET | Freq: Two times a day (BID) | ORAL | 2 refills | Status: DC

## 2021-10-04 MED ORDER — HEPARIN SODIUM (PORCINE) 1000 UNIT/ML IJ SOLN
INTRAMUSCULAR | Status: AC
  Administered 2021-10-04: 1000 [IU]
  Filled 2021-10-04: qty 5

## 2021-10-04 NOTE — Hospital Course (Addendum)
Dorothy Harmon is a 71 y.o. female with past medical history of hypertension, hyperlipidemia, diastolic heart failure, CVA with residual mild memory impairment, end-stage renal disease hemodialysis Monday Wednesday Friday, diabetes mellitus type 2 presented to hospital with intermittent vaginal bleeding for 6 weeks.  CT scan of the abdomen revealed 6 cm adnexal mass unchanged from 01/07/2021.  In the ED, patient had a hemoglobin of 8.9 with a potassium of 3.5.  Patient was given 1 unit of packed RBC.  Patient is on Plavix and statins after his stroke in 2014.  Patient also underwent left upper extremity basilic second stage transposition on 4/6 with Dr. Unk Lightning vascular surgery. ? ?Assessment and Plan: ?Acute blood loss anemia secondary to vaginal bleeding ?Hemoglobin of 13.5 on 09/23/2021.  Presented with hemoglobin of 8.9.  Received 1 unit of packed RBC.  Hemoglobin today at 10.3 .  Been seen by OB/GYN and has been started on Megace.   Pending labs including CEA, CA 19-9, CA125 sent from the hospital.  Patient did have some vaginal bleeding this morning but much less than yesterday.  She is very motivated to go home today. ? ? Pelvic mass ?CT scan with unchanged mass.  Likely reflect a ovarian mass or uterine fibroid with endometrial mass noted on prior ultrasound.  Pelvic ultrasound from 11/2020 Abnormal endometrial complex distended by fluid and a 3.4 cm diameter endometrial mass which demonstrates internal blood flow on color Doppler imaging for which it was questioned polyp versus neoplasm.  Plans were for patient to have D&C/hysterectomy with Dr. Rip Harbour on 10/26/2021.  Follow CA125, CEA, CA19-9. ? ?ESRD on HD ?On Monday, Wednesday, and Friday schedule,. She had second stage of fistula transposition on 4/6 with Dr. Virl Cagey. Nephrology followed the patient during hospitalization for hemodialysis needs. ? ?Diabetes mellitus type 2 with complication, with long-term use of insulin ?Patient is on glargine 34 units at  nighttime at home.  ? ? History of CVA ?History of stroke in 2014 with memory impairment.  Continue statins.   Plan to resume Plavix on 10/06/2021 ?  ?Hypertension ?Overall controlled.  Not on antihypertensives at this time.  ?  ?Dyslipidemia ?-Continue statins ?

## 2021-10-04 NOTE — Progress Notes (Signed)
NEW ADMISSION NOTE ?New Admission Note:  ? ?Arrival Method: ED stretcher ?Mental Orientation: AAOx4 ?Telemetry: ?Assessment: Completed ?Skin: To be completed ?IV: RFA dislogded ?Pain:0/10 ?Tubes: purewick in place for bleeding ?Safety Measures: Safety Fall Prevention Plan has been given, discussed and signed ?Admission: Completed ?[facility] Orientation: Patient has been orientated to the room, unit and staff.  ?Family: none at bedside ? ?Orders have been reviewed and implemented. Will continue to monitor the patient. Call light has been placed within reach and bed alarm has been activated.  ? ?Vira Agar, RN   ?

## 2021-10-04 NOTE — Discharge Summary (Signed)
?Physician Discharge Summary ?  ?Patient: Dorothy Harmon: [id number] DOB: 1951/05/07  ?Admit date:     10/03/2021  ?Discharge date: 10/05/21  ?Discharge Physician: Corrie Mckusick Kalsey Lull  ? ?PCP: Tisovec, Fransico Him, MD  ? ?Recommendations at discharge:  ? ?Follow-up with your primary care physician in 1 week.  Check blood work at that time. ?Patient has been started on Megace 40 mg twice daily.  Will need to follow-up with OB/GYN as outpatient for definite management of vaginal bleeding. ?Follow CA125, CEA, CA19-9 sent from the hospital.. ? ?Discharge Diagnoses: ?Principal Problem: ?  Vaginal bleeding ?Active Problems: ?  Acute blood loss anemia ?  Essential hypertension ?  H/O: stroke ?  Dyslipidemia ?  Type 2 diabetes mellitus with complication, with long-term current use of insulin ([place]) ?  Pelvic mass ? ?Resolved Problems: ?  * No resolved hospital problems. * ? ?Hospital Course: ?Dorothy Harmon is a 71 y.o. female with past medical history of hypertension, hyperlipidemia, diastolic heart failure, CVA with residual mild memory impairment, end-stage renal disease hemodialysis Monday Wednesday Friday, diabetes mellitus type 2 presented to hospital with intermittent vaginal bleeding for 6 weeks.  CT scan of the abdomen revealed 6 cm adnexal mass unchanged from 01/07/2021.  In the ED, patient had a hemoglobin of 8.9 with a potassium of 3.5.  Patient was given 1 unit of packed RBC.  Patient is on Plavix and statins after his stroke in 2014.  Patient also underwent left upper extremity basilic second stage transposition on 4/6 with Dr. Unk Lightning vascular surgery. ? ?Assessment and Plan: ?Acute blood loss anemia secondary to vaginal bleeding ?Hemoglobin of 13.5 on 09/23/2021.  Presented with hemoglobin of 8.9.  Received 1 unit of packed RBC.  Hemoglobin today at 10.3 .  Been seen by OB/GYN and has been started on Megace.   Pending labs including CEA, CA 19-9, CA125 sent from the hospital.  Patient did have some vaginal bleeding  this morning but much less than yesterday.  She is very motivated to go home today. ? ? Pelvic mass ?CT scan with unchanged mass.  Likely reflect a ovarian mass or uterine fibroid with endometrial mass noted on prior ultrasound.  Pelvic ultrasound from 11/2020 Abnormal endometrial complex distended by fluid and a 3.4 cm diameter endometrial mass which demonstrates internal blood flow on color Doppler imaging for which it was questioned polyp versus neoplasm.  Plans were for patient to have D&C/hysterectomy with Dr. Rip Harbour on 10/26/2021.  Follow CA125, CEA, CA19-9. ? ?ESRD on HD ?On Monday, Wednesday, and Friday schedule,. She had second stage of fistula transposition on 4/6 with Dr. Virl Cagey. Nephrology followed the patient during hospitalization for hemodialysis needs. ? ?Diabetes mellitus type 2 with complication, with long-term use of insulin ?Patient is on glargine 34 units at nighttime at home.  ? ? History of CVA ?History of stroke in 2014 with memory impairment.  Continue statins.   Plan to resume Plavix on 10/06/2021 ?  ?Hypertension ?Overall controlled.  Not on antihypertensives at this time.  ?  ?Dyslipidemia ?-Continue statins ? ?Consultants: OB/GYN ? ?Procedures performed:  ?Transfusion of packed RBC 1 unit ?Hemodialysis ? ?Disposition: Home.  Spoke with the patient's daughter at bedside. ? ?Diet recommendation:  ?Discharge Diet Orders (From admission, onward)  ? ?  Start     Ordered  ? 10/04/21 0000  Diet - low sodium heart healthy       ? 10/04/21 1610  ? 10/04/21 0000  Diet Carb Modified       ?  10/04/21 1610  ? ?  ?  ? ?  ? ?Cardiac and Carb modified diet ?DISCHARGE MEDICATION: ?Allergies as of 10/05/2021   ? ?   Reactions  ? Ace Inhibitors Other (See Comments)  ? cough  ? Codeine Itching, Swelling  ? Morphine And Related Other (See Comments)  ? Loss of consciousness, feeling sick, faint  ? Penicillins Itching, Swelling  ? Did it involve swelling of the face/tongue/throat, SOB, or low BP? N ?Did it involve  sudden or severe rash/hives, skin peeling, or any reaction on the inside of your mouth or nose? N ?Did you need to seek medical attention at a hospital or doctor's office? N ?When did it last happen?   45 years ago    ?If all above answers are "NO", may proceed with cephalosporin use.  ? ?  ? ?  ?Medication List  ?  ? ?TAKE these medications   ? ?acetaminophen 500 MG tablet ?Commonly known as: TYLENOL ?Take 1,000 mg by mouth every 6 (six) hours as needed for moderate pain or mild pain. ?  ?clopidogrel 75 MG tablet ?Commonly known as: Plavix ?Take 1 tablet (75 mg total) by mouth every evening. ?Start taking on: October 06, 2021 ?  ?megestrol 40 MG tablet ?Commonly known as: MEGACE ?Take 1 tablet (40 mg total) by mouth 2 (two) times daily. ?  ?rosuvastatin 20 MG tablet ?Commonly known as: CRESTOR ?Take 1 tablet (20 mg total) by mouth daily. ?What changed: when to take this ?  ?Toujeo Max SoloStar 300 UNIT/ML Solostar Pen ?Generic drug: insulin glargine (2 Unit Dial) ?Inject 34 Units into the skin at bedtime. ?  ? ?  ? ?Subjective ?Today, patient was seen and examined at bedside.  Patient states that she does have mild vaginal bleeding but much improved than yesterday.  Denies any chest pain dizziness lightheadedness. ? ?Discharge Exam: ?Filed Weights  ? 10/03/21 1232 10/05/21 0438  ?Weight: 77.1 kg 75.7 kg  ?Body mass index is 28.65 kg/m?.  ? ?General:  Average built, not in obvious distress ?HENT: Mild pallor noted.  Oral mucosa is moist.  ?Chest:  Clear breath sounds.  Diminished breath sounds bilaterally. No crackles or wheezes.  ?CVS: S1 &S2 heard. No murmur.  Regular rate and rhythm. ?Abdomen: Soft, nontender, nondistended.  Bowel sounds are heard.   ?Extremities: No cyanosis, clubbing or edema.  Peripheral pulses are palpable.  Left upper extremity fistula site clean dry and intact. ?Psych: Alert, awake and oriented, normal mood ?CNS:  No cranial nerve deficits.  Power equal in all extremities.   ?Skin: Warm and  dry.  No rashes noted. ? ? ?Condition at discharge: good ? ?The results of significant diagnostics from this hospitalization (including imaging, microbiology, ancillary and laboratory) are listed below for reference.  ? ?Imaging Studies: ?No results found. ? ?Microbiology: ?Results for orders placed or performed during the hospital encounter of 12/26/19  ?Urine culture     Status: Abnormal  ? Collection Time: 12/26/19 12:42 AM  ? Specimen: Urine, Clean Catch  ?Result Value Ref Range Status  ? Specimen Description   Final  ?  URINE, CLEAN CATCH ?Performed at [facility], [place] [address] [place], [place] [address] ?  ? Special Requests   Final  ?  NONE ?Performed at [facility], [place] [address] [place], [place] [address] ?  ? Culture (A)  Final  ?  40,000 COLONIES/mL GROUP B STREP(S.AGALACTIAE)ISOLATED ?TESTING AGAINST S. AGALACTIAE NOT ROUTINELY PERFORMED DUE TO  PREDICTABILITY OF AMP/PEN/VAN SUSCEPTIBILITY. ?Performed at [facility], [place] [address] [place], [place] [address] ?  ? Report Status 12/27/2019 FINAL  Final  ? ? ?Labs: ?CBC: ?Recent Labs  ?Lab 10/03/21 ?1237 10/03/21 ?2156 10/04/21 ?0501 10/05/21 ?0342  ?WBC 5.7  --  7.0 7.3  ?NEUTROABS 3.6  --   --   --   ?HGB 8.9* 9.9* 9.4* 10.3*  ?HCT 28.5* 30.1* 27.9* 31.6*  ?MCV 97.6  --  93.0 93.5  ?PLT 286  --  275 284  ? ?Basic Metabolic Panel: ?Recent Labs  ?Lab 10/03/21 ?1237 10/04/21 ?0501  ?NA 139 139  ?K 3.5 4.0  ?CL 101 103  ?CO2 24 28  ?GLUCOSE 138* 156*  ?BUN 45* 65*  ?CREATININE 3.87* 4.47*  ?CALCIUM 8.8* 8.5*  ?PHOS  --  4.5  ? ?Liver Function Tests: ?Recent Labs  ?Lab 10/03/21 ?1237 10/04/21 ?0501  ?AST 21  --   ?ALT 23  --   ?ALKPHOS 80  --   ?BILITOT 0.6  --   ?PROT 6.2*  --   ?ALBUMIN 3.0* 2.8*  ? ?CBG: ?Recent Labs  ?Lab 10/03/21 ?2150 10/04/21 ?1152 10/04/21 ?1751 10/04/21 ?2028 10/05/21 ?0731  ?GLUCAP 182* 118* 173* 210* 160*  ? ? ?Discharge time spent: greater than 30 minutes. ? ?Signed: ?Flora Lipps, MD ?[facility] ?10/05/2021 ?

## 2021-10-04 NOTE — Procedures (Signed)
? ?  I was present at this dialysis session, have reviewed the session itself and made  appropriate changes ?Kelly Splinter MD ?[employer] ?pager [phone]   ?10/04/2021, 10:10 AM ? ? ?

## 2021-10-04 NOTE — Progress Notes (Signed)
?  Transition of Care (TOC) Screening Note ? ? ?Patient Details  ?Name: Dorothy Harmon ?Date of Birth: 06/06/1951 ? ? ?Transition of Care (TOC) CM/SW Contact:    ?Tom-Johnson, Renea Ee, RN ?Phone Number: ?10/04/2021, 12:57 PM ? ?Patient is from home. Admitted for Chronic Vaginal Bleeding. Has hx of ESRD, on MWF schedule and Rt Adnexal mass and thickened Endometrium. Had inpatient dialysis today.  ?Transition of Care Department [facility]) has reviewed patient and no TOC needs or recommendations have been identified at this time. TOC will continue to monitor patient advancement through interdisciplinary progression rounds. If new patient transition needs arise, please place a TOC consult. ?  ?

## 2021-10-04 NOTE — ED Notes (Signed)
This RN gave report to HD RN ?

## 2021-10-04 NOTE — Progress Notes (Signed)
Patient had discharge orders and patient stated that daughter was not available to pick her because she was at home and she had no key to her house, and son was in [place] at work with no transportation.  This RN contacted case management who was able to get a cab voucher.  Patient stated that she had no money for her discharge medication. Also when patient stood up to change clothes, she had a large blood clot to fall to the floor.   ?Patient stated that she did not think that she was ready for d/c with the amount of bleeding.  Made MD aware and discharge was put on hold.  Case management made this RN aware that they can send prescription to [facility] with a possible copay to be waved tomorrow. ? ?Aurther Loft, RN ?

## 2021-10-04 NOTE — Progress Notes (Signed)
?PROGRESS NOTE ? ? ? ?Dorothy Harmon  GUY:[id number] DOB: 05/06/1951 DOA: 10/03/2021 ?PCP: Haywood Pao, MD  ? ? ?Brief Narrative:  ?Dorothy Harmon is a 71 y.o. female with past medical history of hypertension, hyperlipidemia, diastolic heart failure, CVA with residual mild memory impairment, end-stage renal disease hemodialysis Monday Wednesday Friday, diabetes mellitus type 2 presented to hospital with intermittent vaginal bleeding for 6 weeks.  CT scan of the abdomen revealed 6 cm adnexal mass unchanged from 01/07/2021.  In the ED, patient had a hemoglobin of 8.9 with a potassium of 3.5.  Patient was given 1 unit of packed RBC.  Patient is on Plavix and statins after his stroke in 2014.  Patient also underwent left upper extremity basilic second stage transposition on 4/6 with Dr. Unk Lightning vascular surgery. ? ?Assessment and Plan: ?Acute blood loss anemia secondary to vaginal bleeding ?Last hemoglobin of 13.5 on 09/23/2021.  Presented with hemoglobin of 8.9.  Received 1 unit of packed RBC.  Hemoglobin today at 9.4.  Patient has a large vaginal bleeding today and does not feel comfortable going home.  Been seen by OB/GYN and has been started on Megace.   Pending labs including CEA, CA 19-9, CA125 sent from the hospital.  Continue to watch for bleeding during hospitalization.  Will monitor for vaginal bleeding every shift ? ? Pelvic mass ?CT scan with unchanged mass.  Likely reflect a ovarian mass or uterine fibroid with endometrial mass noted on prior ultrasound.  Pelvic ultrasound from 11/2020 Abnormal endometrial complex distended by fluid and a 3.4 cm diameter endometrial mass which demonstrates internal blood flow on color Doppler imaging for which it was questioned polyp versus neoplasm.  Plans were for patient to have D&C/hysterectomy with Dr. Rip Harbour on 10/26/2021.  Follow CA125, CEA, CA19-9. ? ?ESRD on HD ?On Monday, Wednesday, and Friday schedule She had second stage of fistula transposition on 4/6 with Dr.  Virl Cagey. Nephrology on board for hemodialysis.  Had hemodialysis on 10/04/2021. ? ?Diabetes mellitus type 2 with complication, with long-term use of insulin ?Patient is on glargine 34 units at nighttime at home.  Sliding scale insulin and long-acting insulin while in the hospital. ? ? History of CVA ?History of stroke in 2014 with memory impairment.  Continue statins.  Plavix on hold due to vaginal bleeding.  Plan to resume 10/06/2021 if bleeding slows down.. ?  ?Hypertension ?Overall controlled.  Not on antihypertensives at this time.  Continue to monitor ?  ?Dyslipidemia ?-Continue statins  ? ? DVT prophylaxis: SCDs Start: 10/03/21 1528 ? ? ?Code Status:   ?  Code Status: Full Code ? ?Disposition: Home likely in 1 to 2 days. ? ?Status is: Observation ? ?The patient will require care spanning > 2 midnights and should be moved to inpatient because: Continued vaginal bleeding, ? ? Family Communication: I tried to reach the patient's daughter on the phone multiple times but was unable to reach her. ? ?Consultants:  ?Gynecology  ? ?Procedures:  ?Hemodialysis ? ?Antimicrobials:  ?None ? ?Anti-infectives (From admission, onward)  ? ? None  ? ?  ? ?Subjective: ?Today, patient was seen and examined at bedside.  Patient was feeling better but then when she tried to get up she had large amount of vaginal bleeding. ? ?Objective: ?Vitals:  ? 10/04/21 0930 10/04/21 1000 10/04/21 1025 10/04/21 1059  ?BP: 127/68 (!) 141/75 (!) 152/62 140/65  ?Pulse: 76 83 70 72  ?Resp: 13 19  17   ?Temp:   97.8 ?F (36.6 ?C)  97.8 ?F (36.6 ?C)  ?TempSrc:   Temporal Oral  ?SpO2:   97% 100%  ?Weight:      ?Height:      ? ? ?Intake/Output Summary (Last 24 hours) at 10/04/2021 1747 ?Last data filed at 10/04/2021 1300 ?Gross per 24 hour  ?Intake 615 ml  ?Output 1300 ml  ?Net -685 ml  ? ?Filed Weights  ? 10/03/21 1232  ?Weight: 77.1 kg  ? ? ?Physical Examination: ? ?General:  Average built, not in obvious distress ?HENT: Mild pallor noted.  Oral mucosa is  moist.  ?Chest:  Clear breath sounds.  Diminished breath sounds bilaterally. No crackles or wheezes.  ?CVS: S1 &S2 heard. No murmur.  Regular rate and rhythm. ?Abdomen: Soft, nontender, nondistended.  Bowel sounds are heard.   ?Extremities: No cyanosis, clubbing or edema.  Peripheral pulses are palpable.  Left upper extremity fistula site clean dry and intact. ?Psych: Alert, awake and oriented, normal mood ?CNS:  No cranial nerve deficits.  Moves all extremities. ?Skin: Warm and dry.  No rashes noted. ? ?Data Reviewed:  ? ?CBC: ?Recent Labs  ?Lab 10/03/21 ?1237 10/03/21 ?2156 10/04/21 ?0501  ?WBC 5.7  --  7.0  ?NEUTROABS 3.6  --   --   ?HGB 8.9* 9.9* 9.4*  ?HCT 28.5* 30.1* 27.9*  ?MCV 97.6  --  93.0  ?PLT 286  --  275  ? ? ?Basic Metabolic Panel: ?Recent Labs  ?Lab 10/03/21 ?1237 10/04/21 ?0501  ?NA 139 139  ?K 3.5 4.0  ?CL 101 103  ?CO2 24 28  ?GLUCOSE 138* 156*  ?BUN 45* 65*  ?CREATININE 3.87* 4.47*  ?CALCIUM 8.8* 8.5*  ?PHOS  --  4.5  ? ? ?Liver Function Tests: ?Recent Labs  ?Lab 10/03/21 ?1237 10/04/21 ?0501  ?AST 21  --   ?ALT 23  --   ?ALKPHOS 80  --   ?BILITOT 0.6  --   ?PROT 6.2*  --   ?ALBUMIN 3.0* 2.8*  ? ? ? ?Radiology Studies: ?No results found. ? ? ? LOS: 0 days  ? ? ?Flora Lipps, MD ?[facility] ?Available via Epic secure chat 7am-7pm ?After these hours, please refer to coverage provider listed on [email or web address] ?10/04/2021, 5:47 PM  ? ? ?

## 2021-10-04 NOTE — Consult Note (Signed)
[facility] ?Renal Consultation Note  ?  ?Indication for Consultation:  Management of ESRD/hemodialysis, anemia, hypertension/volume, and secondary hyperparathyroidism. ?PCP: ? ?HPI: Dorothy Harmon is a 71 y.o. female with ESRD, diastolic HF, Hx CVA, HTN, HL, T2DM, and known R adnexal and endometrial masses who was admitted with vaginal bleeding. ? ?Presented to ED on 4/16 with acute on chronic vaginal bleeding over the weekend and progressive weakness/lightheaded sensation. She has known R adnexal mass and thickened endometrium and had previously been scheduled for possible D&C v. hysterectomy in May.  ? ?In the ED, her vitals were normal. Labs showed K 3.5, Ca 8.8, Hgb 8.9. She was transfused 1U PRBCs. Megace started and GYN consulted. She was admitted OBSERVATION status. ? ?She was also due for HD prompting nephrology consultation. Denies any recent HD problems. Last HD was 4/14 at [facility] which she completed without issue. Last Hgb there was 10.8 on 09/29/21. She uses TDC as access, with recent (4/6) stage 2 LUE BVT which is healing. ? ?Past Medical History:  ?Diagnosis Date  ? Anticoagulant long-term use   ? plavix--- managed by pcp, dr Osborne Casco  ? Chronic diastolic CHF (congestive heart failure) ([place])   ? last echo in epic 03-04-2021  ef 55%, G1DD, mild TR  ? CKD (chronic kidney disease), stage IV ([place])   ? nephrologist-- dr Johnney Ou, on dialysis  ? Fibromyalgia   ? History of CVA (cerebrovascular accident) 04/21/2013  ? hostpiral admission -- acute left thalamus infarct and imaging showed previous old small vessel infart right cerebullum  ? History of kidney stones   ? History of seizure 12/2006  ? h/o 1 seizure   ? Hypercholesteremia   ? Hypertension   ? Hypertensive retinopathy of both eyes   ? IBS (irritable bowel syndrome)   ? MDD (major depressive disorder)   ? OA (osteoarthritis)   ? OSA (obstructive sleep apnea)   ? per pt had sleep many yrs ago, intolerant to cpap  ? Peripheral neuropathy   ?  Stroke [facility])   ? Thickened endometrium   ? Type 2 diabetes mellitus treated with insulin ([place])   ? followed by pcp---  (03-19-2021 per pt checks daily in am,  fasting sugar-- 90s)  ? ?Past Surgical History:  ?Procedure Laterality Date  ? AV FISTULA PLACEMENT Left 07/06/2021  ? Procedure: LEFT FIRST STAGE BRACHIOBASILIC FISTULA CREATION;  Surgeon: Broadus John, MD;  Location: [facility] OR;  Service: Vascular;  Laterality: Left;  PERIPHERAL NERVE BLOCK  ? BASCILIC VEIN TRANSPOSITION Left 09/23/2021  ? Procedure: LEFT UPPER EXTREMITY BASILIC SECOND STAGE TRANSPOSITION;  Surgeon: Broadus John, MD;  Location: [facility] OR;  Service: Vascular;  Laterality: Left;  PERIPHERAL NERVE BLOCK  ? CARDIAC CATHETERIZATION  12/01/2003  ? @[facility]  by dr Martinique;  normal coronaries, normal lvf, ef 65%, mild to moderate pulm htn (cath done for positive cardiolite)  ? LAPAROSCOPIC CHOLECYSTECTOMY  04/18/2002  ? @[facility]   ? RHINOPLASTY  1974  ? TUBAL LIGATION Bilateral   ? yrs ago  ? WRIST FRACTURE SURGERY  1992  ? ?Family History  ?Problem Relation Age of Onset  ? Heart attack Father   ? Diabetes Mother   ?     alive, 60yo (2014)  ? Hypertension Mother   ? ?Social History: ? reports that she has never smoked. She has never used smokeless tobacco. She reports that she does not drink alcohol and does not use drugs. ? ?ROS: As per HPI otherwise negative. ? ?Review of  Systems: ?Gen: Denies any fever, chills. + weakness ?HEENT: Denies blurred vision, sore throat or epistaxis.  ?CV: Denies chest pain, angina, palpitations, orthopnea, PND ?Resp: Denies dyspnea, cough, sputum, or wheezing. ?GI: Denies nausea, vomiting, abdominal pain, constipation or diarrhea. ?GU: Denies dysuria. ?MS: Denies joint pain, muscle pain or cramps.  ?Derm: Denies rashes, itching, or ulcerations. ?Psych: Denies depression and anxiety. ?Heme: + vaginal bleeding ?Endocrine: No hypoglycemia or thyroid disease. ? ?Physical Exam: ?Vitals:  ? 10/04/21 0716 10/04/21 0730 10/04/21 0800 10/04/21  0830  ?BP: 131/69 120/68 125/70 121/72  ?Pulse: 77 78 78 74  ?Resp: 20  13 15   ?Temp:      ?TempSrc:      ?SpO2:      ?Weight:      ?Height:      ?   ?General: Well developed, well nourished, in no acute distress. Room air. ?Head: Normocephalic, atraumatic, sclera non-icteric, mucus membranes are moist. ?Neck: Supple without lymphadenopathy/masses. JVD not elevated. ?Lungs: Clear bilaterally to auscultation without wheezes, rales, or rhonchi. Breathing is unlabored. ?Heart: RRR with normal S1, S2. No murmurs, rubs, or gallops appreciated. ?Abdomen: Soft, non-tender, non-distended with normoactive bowel sounds. No rebound/guarding. No obvious abdominal masses. ?Musculoskeletal:  Strength and tone appear normal for age. ?Lower extremities: No edema or ischemic changes, no open wounds. ?Neuro: Alert and oriented X 3. Moves all extremities spontaneously. ?Psych:  Responds to questions appropriately with a normal affect. ?Dialysis Access: TDC + LUE AVF + bruit ? ?Allergies  ?Allergen Reactions  ? Ace Inhibitors Other (See Comments)  ?  cough  ? Codeine Itching and Swelling  ? Morphine And Related Other (See Comments)  ?  Loss of consciousness, feeling sick, faint  ? Penicillins Itching and Swelling  ?  Did it involve swelling of the face/tongue/throat, SOB, or low BP? N ?Did it involve sudden or severe rash/hives, skin peeling, or any reaction on the inside of your mouth or nose? N ?Did you need to seek medical attention at a hospital or doctor's office? N ?When did it last happen?   45 years ago    ?If all above answers are "NO", may proceed with cephalosporin use. ?  ? ?Prior to Admission medications   ?Medication Sig Start Date End Date Taking? Authorizing Provider  ?acetaminophen (TYLENOL) 500 MG tablet Take 1,000 mg by mouth every 6 (six) hours as needed for moderate pain or mild pain.   Yes [provider]  ?clopidogrel (PLAVIX) 75 MG tablet Take 1 tablet (75 mg total) by mouth daily with  breakfast. ?Patient taking differently: Take 75 mg by mouth every evening. 04/24/13  Yes Effie Berkshire, MD  ?insulin glargine, 2 Unit Dial, (TOUJEO MAX SOLOSTAR) 300 UNIT/ML Solostar Pen Inject 34 Units into the skin at bedtime.   Yes [provider]  ?rosuvastatin (CRESTOR) 20 MG tablet Take 1 tablet (20 mg total) by mouth daily. ?Patient taking differently: Take 20 mg by mouth every evening. 08/24/20  Yes Patwardhan, Reynold Bowen, MD  ? ?Current Facility-Administered Medications  ?Medication Dose Route Frequency Provider Last Rate Last Admin  ? acetaminophen (TYLENOL) tablet 650 mg  650 mg Oral Q6H PRN Fuller Plan A, MD      ? Or  ? acetaminophen (TYLENOL) suppository 650 mg  650 mg Rectal Q6H PRN Norval Morton, MD      ? Chlorhexidine Gluconate Cloth 2 % PADS 6 each  6 each Topical Q0600 Roney Jaffe, MD      ? insulin aspart (  novoLOG) injection 0-9 Units  0-9 Units Subcutaneous TID WC Norval Morton, MD   1 Units at 10/03/21 1639  ? insulin glargine-yfgn (SEMGLEE) injection 20 Units  20 Units Subcutaneous QHS Fuller Plan A, MD   20 Units at 10/03/21 2226  ? megestrol (MEGACE) tablet 40 mg  40 mg Oral BID Godfrey Pick, MD   40 mg at 10/03/21 2155  ? rosuvastatin (CRESTOR) tablet 20 mg  20 mg Oral QPM Smith, Rondell A, MD   20 mg at 10/03/21 1723  ? sodium chloride flush (NS) 0.9 % injection 3 mL  3 mL Intravenous Q12H Smith, Rondell A, MD   3 mL at 10/03/21 2155  ? ?Labs: ?Basic Metabolic Panel: ?Recent Labs  ?Lab 10/03/21 ?1237 10/04/21 ?0501  ?NA 139 139  ?K 3.5 4.0  ?CL 101 103  ?CO2 24 28  ?GLUCOSE 138* 156*  ?BUN 45* 65*  ?CREATININE 3.87* 4.47*  ?CALCIUM 8.8* 8.5*  ?PHOS  --  4.5  ? ?Liver Function Tests: ?Recent Labs  ?Lab 10/03/21 ?1237 10/04/21 ?0501  ?AST 21  --   ?ALT 23  --   ?ALKPHOS 80  --   ?BILITOT 0.6  --   ?PROT 6.2*  --   ?ALBUMIN 3.0* 2.8*  ? ?CBC: ?Recent Labs  ?Lab 10/03/21 ?1237 10/03/21 ?2156 10/04/21 ?0501  ?WBC 5.7  --  7.0  ?NEUTROABS 3.6  --   --   ?HGB 8.9* 9.9* 9.4*   ?HCT 28.5* 30.1* 27.9*  ?MCV 97.6  --  93.0  ?PLT 286  --  275  ? ?CBG: ?Recent Labs  ?Lab 10/03/21 ?1625 10/03/21 ?2150  ?GLUCAP 122* 182*  ? ?Dialysis Orders: ?MWF at [facility] ?4hr, 300/500, EDW 75.5kg, 3K/2.5Ca, TDC,

## 2021-10-05 LAB — CBC
HCT: 31.6 % — ABNORMAL LOW (ref 36.0–46.0)
Hemoglobin: 10.3 g/dL — ABNORMAL LOW (ref 12.0–15.0)
MCH: 30.5 pg (ref 26.0–34.0)
MCHC: 32.6 g/dL (ref 30.0–36.0)
MCV: 93.5 fL (ref 80.0–100.0)
Platelets: 284 10*3/uL (ref 150–400)
RBC: 3.38 MIL/uL — ABNORMAL LOW (ref 3.87–5.11)
RDW: 13.4 % (ref 11.5–15.5)
WBC: 7.3 10*3/uL (ref 4.0–10.5)
nRBC: 0 % (ref 0.0–0.2)

## 2021-10-05 LAB — GLUCOSE, CAPILLARY: Glucose-Capillary: 160 mg/dL — ABNORMAL HIGH (ref 70–99)

## 2021-10-05 LAB — CA 125: Cancer Antigen (CA) 125: 23 U/mL (ref 0.0–38.1)

## 2021-10-05 LAB — HEPATITIS B SURFACE ANTIBODY, QUANTITATIVE: Hep B S AB Quant (Post): <3.1 m[IU]/mL — ABNORMAL LOW (ref >9.9)

## 2021-10-05 LAB — CANCER ANTIGEN 19-9: CA 19-9: 5 U/mL (ref 0–35)

## 2021-10-05 LAB — CEA: CEA: 1 ng/mL (ref 0.0–4.7)

## 2021-10-05 NOTE — TOC Transition Note (Signed)
Transition of Care (TOC) - CM/SW Discharge Note ? ? ?Patient Details  ?Name: Dorothy Harmon ?MRN: [id number] ?Date of Birth: 02-14-1951 ? ?Transition of Care (TOC) CM/SW Contact:  ?Tom-Johnson, Renea Ee, RN ?Phone Number: ?10/05/2021, 10:10 AM ? ? ?Clinical Narrative:    ? ?Patient was scheduled for discharge yesterday 10/04/21. Discharge cancelled d/t bleeding. Patient is scheduled for discharge today. Daughter to transport. No further TOC needs noted. ?  ?  ? ? ?Patient Goals and CMS Choice ?  ?  ?  ? ?Discharge Placement ?  ?           ?  ?  ?  ?  ? ?Discharge Plan and Services ?  ?  ?           ?  ?  ?  ?  ?  ?  ?  ?  ?  ?  ? ?Social Determinants of Health (SDOH) Interventions ?  ? ? ?Readmission Risk Interventions ?   ? View : No data to display.  ?  ?  ?  ? ? ? ? ? ?

## 2021-10-05 NOTE — Progress Notes (Signed)
Contacted [facility] to make clinic aware of pt's d/c today and that pt should resume care tomorrow.  ? ?Melven Sartorius ?Renal Navigator ?[phone] ?

## 2021-10-05 NOTE — Progress Notes (Signed)
DISCHARGE NOTE HOME ?Dorothy Harmon to be discharged Home per MD order. Discussed prescriptions and follow up appointments with the patient. Prescriptions given to patient; medication list explained in detail. Patient verbalized understanding. ? ?Skin clean, dry and intact without evidence of skin break down, no evidence of skin tears noted. IV catheter discontinued intact. Site without signs and symptoms of complications. Dressing and pressure applied. Pt denies pain at the site currently. No complaints noted. ? ?Patient free of lines, drains, and wounds.  ? ?An After Visit Summary (AVS) was printed and given to the patient. ?Patient escorted via wheelchair, and discharged home via private auto. ? ?Berneta Levins, RN  ?

## 2021-10-19 NOTE — H&P (Signed)
Dorothy Harmon is an 71 y.o. female with PMB and thicken endometrium on U/S.  ? ?H/O ESRD on dialysis.  ? ?Menstrual History: ?Menarche age: 71 ?No LMP recorded. Patient is postmenopausal. ?  ? ?Past Medical History:  ?Diagnosis Date  ? Anticoagulant long-term use   ? plavix--- managed by pcp, dr Osborne Casco  ? Chronic diastolic CHF (congestive heart failure) ([place])   ? last echo in epic 03-04-2021  ef 55%, G1DD, mild TR  ? CKD (chronic kidney disease), stage IV ([place])   ? nephrologist-- dr Johnney Ou, on dialysis  ? Fibromyalgia   ? History of CVA (cerebrovascular accident) 04/21/2013  ? hostpiral admission -- acute left thalamus infarct and imaging showed previous old small vessel infart right cerebullum  ? History of kidney stones   ? History of seizure 12/2006  ? h/o 1 seizure   ? Hypercholesteremia   ? Hypertension   ? Hypertensive retinopathy of both eyes   ? IBS (irritable bowel syndrome)   ? MDD (major depressive disorder)   ? OA (osteoarthritis)   ? OSA (obstructive sleep apnea)   ? per pt had sleep many yrs ago, intolerant to cpap  ? Peripheral neuropathy   ? Stroke [facility])   ? Thickened endometrium   ? Type 2 diabetes mellitus treated with insulin ([place])   ? followed by pcp---  (03-19-2021 per pt checks daily in am,  fasting sugar-- 90s)  ? ? ?Past Surgical History:  ?Procedure Laterality Date  ? AV FISTULA PLACEMENT Left 07/06/2021  ? Procedure: LEFT FIRST STAGE BRACHIOBASILIC FISTULA CREATION;  Surgeon: Broadus John, MD;  Location[facility] OR;  Service: Vascular;  Laterality: Left;  PERIPHERAL NERVE BLOCK  ? BASCILIC VEIN TRANSPOSITION Left 09/23/2021  ? Procedure: LEFT UPPER EXTREMITY BASILIC SECOND STAGE TRANSPOSITION;  Surgeon: Broadus John, MD;  Location: [facility] OR;  Service: Vascular;  Laterality: Left;  PERIPHERAL NERVE BLOCK  ? CARDIAC CATHETERIZATION  12/01/2003  ? @[facility]  by dr Martinique;  normal coronaries, normal lvf, ef 65%, mild to moderate pulm htn (cath done for positive cardiolite)  ? LAPAROSCOPIC  CHOLECYSTECTOMY  04/18/2002  ? @[facility]   ? RHINOPLASTY  1974  ? TUBAL LIGATION Bilateral   ? yrs ago  ? WRIST FRACTURE SURGERY  1992  ? ? ?Family History  ?Problem Relation Age of Onset  ? Heart attack Father   ? Diabetes Mother   ?     alive, 30yo (2014)  ? Hypertension Mother   ? ? ?Social History:  reports that she has never smoked. She has never used smokeless tobacco. She reports that she does not drink alcohol and does not use drugs. ? ?Allergies:  ?Allergies  ?Allergen Reactions  ? Ace Inhibitors Other (See Comments)  ?  cough  ? Codeine Itching and Swelling  ? Morphine And Related Other (See Comments)  ?  Loss of consciousness, feeling sick, faint  ? Penicillins Itching and Swelling  ?  Did it involve swelling of the face/tongue/throat, SOB, or low BP? N ?Did it involve sudden or severe rash/hives, skin peeling, or any reaction on the inside of your mouth or nose? N ?Did you need to seek medical attention at a hospital or doctor's office? N ?When did it last happen?   45 years ago    ?If all above answers are "NO", may proceed with cephalosporin use. ?  ? ? ?No medications prior to admission.  ? ? ?Review of Systems  ?Constitutional: Negative.   ?Respiratory: Negative.    ?  Cardiovascular: Negative.   ?Gastrointestinal: Negative.   ?Genitourinary:  Positive for vaginal bleeding.  ? ?There were no vitals taken for this visit. ?Physical Exam ?Constitutional:   ?   Appearance: Normal appearance.  ?Cardiovascular:  ?   Rate and Rhythm: Normal rate and regular rhythm.  ?Pulmonary:  ?   Effort: Pulmonary effort is normal.  ?   Breath sounds: Normal breath sounds.  ?Abdominal:  ?   General: Bowel sounds are normal.  ?   Palpations: Abdomen is soft.  ?Genitourinary: ?   Comments: Nl EGBUS, atrophic vagina, uterus small, mobile ? ? ?No results found for this or any previous visit (from the past 24 hour(s)). ? ?No results found. ? ?Assessment/Plan: ?PMB ?Thicken endometrium ? ?Hysteroscopy D & C recommended to pt. R/B/Post  op care reviewed with pt. Pt verbalized understanding and desires to proceed.  ? ?[name] ?10/19/2021, 1:42 PM ? ?

## 2021-10-21 ENCOUNTER — Encounter (HOSPITAL_BASED_OUTPATIENT_CLINIC_OR_DEPARTMENT_OTHER): Payer: Self-pay | Admitting: Obstetrics and Gynecology

## 2021-10-22 ENCOUNTER — Encounter (HOSPITAL_BASED_OUTPATIENT_CLINIC_OR_DEPARTMENT_OTHER): Payer: Self-pay | Admitting: Obstetrics and Gynecology

## 2021-10-25 ENCOUNTER — Other Ambulatory Visit: Payer: Self-pay

## 2021-10-25 ENCOUNTER — Encounter (HOSPITAL_BASED_OUTPATIENT_CLINIC_OR_DEPARTMENT_OTHER): Payer: Self-pay | Admitting: Obstetrics and Gynecology

## 2021-10-25 NOTE — Progress Notes (Addendum)
ADDENDUM:  received inbox message back from Dr Rip Harbour in epic, pt does not need to stop plavix , may continue. ? ? ?Spoke w/ via phone for pre-op interview--- pt ?Lab needs dos----  cbc, bmp             ?Lab results------ current ekg in epic/ chart ?COVID test -----patient states asymptomatic no test needed ?Arrive at ------- 0930 on 05-09-20223 ?NPO after MN NO Solid Food.  Clear liquids from MN until--- 0830 ?Med rec completed ?Medications to take morning of surgery ----- megace ?Diabetic medication ----- do half dose insulin night before surgery ?Patient instructed no nail polish to be worn day of surgery ?Patient instructed to bring photo id and insurance card day of surgery ?Patient aware to have Driver (ride ) / caregiver  for 24 hours after surgery --daughter, erin ?Patient Special Instructions ----- n/a ? ?Pre-Op special Istructions ----- inbox message Dr Rip Harbour in epic and [place] , his OR scheduler, informed stated she was not given instructions if to stop plavix or note prior to surgery.  Will need clearance faxed if pt to stop prior to surgery which is managed by her pcp. ? ?Patient verbalized understanding of instructions that were given at this phone interview. ?Patient denies shortness of breath, chest pain, fever, cough at this phone interview.  ? ? ?Anesthesia Review: HTN:  hx CVA 11/ 2014 w/ residual mild memory issue per pt;  IDDM2;  OSA no cpap;  ESRD w/ HD on MWF, left arm ?Pt denies cardaic/ stroke s&s, sob, and no peripheral swelling ? ?PCP: Dr Osborne Casco ?Vascular:  lov 09-14-2021 epic ?Primary nephrologist:  Dr Johnney Ou ?Cardiologist : Dr Laurann Montana 03-18-2019 epic,  pt stated followed by pcp and nephrologist ?Chest x-ray : 08-15-2019 ?EKG : 08-10-2021 ?Echo : 0915-2020 ?Stress test: no ?Cardiac Cath : 11-30-2013 epic ?Activity level: denies sob w/ any acvitiy ?Sleep Study/ CPAP : Yes/ NO ?Fasting Blood Sugar :  90s    / Checks Blood Sugar -- times a day:  daily in am ?Blood Thinner/  Instructions /Last Dose: Plavix ?ASA / Instructions/ Last Dose :  no ?Pt stated last dose yesterday 10-24-2021, stated office did give her any instructions whether to or not prior to surgery from dr Rip Harbour office. ?

## 2021-10-26 ENCOUNTER — Ambulatory Visit (HOSPITAL_BASED_OUTPATIENT_CLINIC_OR_DEPARTMENT_OTHER): Payer: Medicare Other | Admitting: Anesthesiology

## 2021-10-26 ENCOUNTER — Encounter (HOSPITAL_BASED_OUTPATIENT_CLINIC_OR_DEPARTMENT_OTHER)
Admission: RE | Disposition: A | Discharge status: Home / Self Care | Payer: Self-pay | Source: Ambulatory Visit | Attending: Obstetrics and Gynecology

## 2021-10-26 ENCOUNTER — Other Ambulatory Visit: Payer: Self-pay

## 2021-10-26 ENCOUNTER — Ambulatory Visit (HOSPITAL_COMMUNITY)
Admission: RE | Admit: 2021-10-26 | Discharge: 2021-10-26 | Disposition: A | Discharge status: Home / Self Care | Payer: Medicare Other | Source: Ambulatory Visit | Attending: Obstetrics and Gynecology | Admitting: Obstetrics and Gynecology

## 2021-10-26 ENCOUNTER — Encounter (HOSPITAL_BASED_OUTPATIENT_CLINIC_OR_DEPARTMENT_OTHER): Payer: Self-pay | Admitting: Obstetrics and Gynecology

## 2021-10-26 DIAGNOSIS — R9389 Abnormal findings on diagnostic imaging of other specified body structures: Secondary | ICD-10-CM | POA: Diagnosis not present

## 2021-10-26 DIAGNOSIS — N950 Postmenopausal bleeding: Secondary | ICD-10-CM | POA: Insufficient documentation

## 2021-10-26 DIAGNOSIS — Z880 Allergy status to penicillin: Secondary | ICD-10-CM | POA: Insufficient documentation

## 2021-10-26 DIAGNOSIS — E1122 Type 2 diabetes mellitus with diabetic chronic kidney disease: Secondary | ICD-10-CM | POA: Insufficient documentation

## 2021-10-26 DIAGNOSIS — Z8673 Personal history of transient ischemic attack (TIA), and cerebral infarction without residual deficits: Secondary | ICD-10-CM | POA: Insufficient documentation

## 2021-10-26 DIAGNOSIS — N186 End stage renal disease: Secondary | ICD-10-CM | POA: Diagnosis not present

## 2021-10-26 DIAGNOSIS — E1142 Type 2 diabetes mellitus with diabetic polyneuropathy: Secondary | ICD-10-CM | POA: Insufficient documentation

## 2021-10-26 DIAGNOSIS — I132 Hypertensive heart and chronic kidney disease with heart failure and with stage 5 chronic kidney disease, or end stage renal disease: Secondary | ICD-10-CM | POA: Diagnosis not present

## 2021-10-26 DIAGNOSIS — Z794 Long term (current) use of insulin: Secondary | ICD-10-CM | POA: Insufficient documentation

## 2021-10-26 DIAGNOSIS — N840 Polyp of corpus uteri: Secondary | ICD-10-CM

## 2021-10-26 DIAGNOSIS — I120 Hypertensive chronic kidney disease with stage 5 chronic kidney disease or end stage renal disease: Secondary | ICD-10-CM

## 2021-10-26 DIAGNOSIS — Z992 Dependence on renal dialysis: Secondary | ICD-10-CM | POA: Diagnosis not present

## 2021-10-26 DIAGNOSIS — I5032 Chronic diastolic (congestive) heart failure: Secondary | ICD-10-CM | POA: Diagnosis not present

## 2021-10-26 DIAGNOSIS — Z7901 Long term (current) use of anticoagulants: Secondary | ICD-10-CM | POA: Diagnosis not present

## 2021-10-26 HISTORY — DX: Dependence on renal dialysis: N18.6

## 2021-10-26 HISTORY — DX: Iron deficiency anemia secondary to blood loss (chronic): D50.0

## 2021-10-26 HISTORY — PX: HYSTEROSCOPY WITH D & C: SHX1775

## 2021-10-26 HISTORY — DX: Postmenopausal bleeding: N95.0

## 2021-10-26 LAB — BASIC METABOLIC PANEL
Anion gap: 15 (ref 5–15)
BUN: 31 mg/dL — ABNORMAL HIGH (ref 8–23)
CO2: 26 mmol/L (ref 22–32)
Calcium: 9.1 mg/dL (ref 8.9–10.3)
Chloride: 100 mmol/L (ref 98–111)
Creatinine, Ser: 3.76 mg/dL — ABNORMAL HIGH (ref 0.44–1.00)
GFR, Estimated: 12 mL/min — ABNORMAL LOW (ref >60)
Glucose, Bld: 146 mg/dL — ABNORMAL HIGH (ref 70–99)
Potassium: 3.6 mmol/L (ref 3.5–5.1)
Sodium: 141 mmol/L (ref 135–145)

## 2021-10-26 LAB — CBC
HCT: 35.9 % — ABNORMAL LOW (ref 36.0–46.0)
Hemoglobin: 11.9 g/dL — ABNORMAL LOW (ref 12.0–15.0)
MCH: 31.6 pg (ref 26.0–34.0)
MCHC: 33.1 g/dL (ref 30.0–36.0)
MCV: 95.2 fL (ref 80.0–100.0)
Platelets: 228 10*3/uL (ref 150–400)
RBC: 3.77 MIL/uL — ABNORMAL LOW (ref 3.87–5.11)
RDW: 13.6 % (ref 11.5–15.5)
WBC: 5.7 10*3/uL (ref 4.0–10.5)
nRBC: 0 % (ref 0.0–0.2)

## 2021-10-26 LAB — GLUCOSE, CAPILLARY: Glucose-Capillary: 145 mg/dL — ABNORMAL HIGH (ref 70–99)

## 2021-10-26 SURGERY — DILATATION AND CURETTAGE /HYSTEROSCOPY
Anesthesia: General | Site: Vagina

## 2021-10-26 MED ORDER — OXYCODONE HCL 5 MG/5ML PO SOLN: 5.0000 mg | Freq: Once | ORAL | Status: DC | PRN

## 2021-10-26 MED ORDER — OXYCODONE HCL 5 MG PO TABS: 5.0000 mg | ORAL_TABLET | Freq: Four times a day (QID) | ORAL | 0 refills | Status: AC | PRN

## 2021-10-26 MED ORDER — ONDANSETRON HCL 4 MG/2ML IJ SOLN
INTRAMUSCULAR | Status: AC
  Filled 2021-10-26: qty 2

## 2021-10-26 MED ORDER — ACETAMINOPHEN 500 MG PO TABS
ORAL_TABLET | ORAL | Status: AC
  Filled 2021-10-26: qty 2

## 2021-10-26 MED ORDER — DEXAMETHASONE SODIUM PHOSPHATE 10 MG/ML IJ SOLN
INTRAMUSCULAR | Status: AC
  Filled 2021-10-26: qty 1

## 2021-10-26 MED ORDER — DOXYCYCLINE HYCLATE 100 MG IV SOLR
200.0000 mg | INTRAVENOUS | Status: AC
  Administered 2021-10-26: 200 mg via INTRAVENOUS
  Filled 2021-10-26: qty 200

## 2021-10-26 MED ORDER — MIDAZOLAM HCL 2 MG/2ML IJ SOLN: 0.5000 mg | Freq: Once | INTRAMUSCULAR | Status: DC | PRN

## 2021-10-26 MED ORDER — PROPOFOL 10 MG/ML IV BOLUS
INTRAVENOUS | Status: DC | PRN
  Administered 2021-10-26: 150 mg via INTRAVENOUS

## 2021-10-26 MED ORDER — MEPERIDINE HCL 25 MG/ML IJ SOLN: 6.2500 mg | INTRAMUSCULAR | Status: DC | PRN

## 2021-10-26 MED ORDER — LACTATED RINGERS IV SOLN: INTRAVENOUS | Status: DC

## 2021-10-26 MED ORDER — KETOROLAC TROMETHAMINE 15 MG/ML IJ SOLN: 15.0000 mg | INTRAMUSCULAR | Status: DC

## 2021-10-26 MED ORDER — LIDOCAINE HCL (CARDIAC) PF 100 MG/5ML IV SOSY
PREFILLED_SYRINGE | INTRAVENOUS | Status: DC | PRN
  Administered 2021-10-26: 100 mg via INTRAVENOUS

## 2021-10-26 MED ORDER — ONDANSETRON HCL 4 MG/2ML IJ SOLN
INTRAMUSCULAR | Status: DC | PRN
  Administered 2021-10-26: 4 mg via INTRAVENOUS

## 2021-10-26 MED ORDER — IBUPROFEN 800 MG PO TABS: 800.0000 mg | ORAL_TABLET | Freq: Three times a day (TID) | ORAL | 0 refills | Status: DC | PRN

## 2021-10-26 MED ORDER — ACETAMINOPHEN 500 MG PO TABS: 1000.0000 mg | ORAL_TABLET | Freq: Once | ORAL | Status: DC

## 2021-10-26 MED ORDER — POVIDONE-IODINE 10 % EX SWAB: 2.0000 "application " | Freq: Once | CUTANEOUS | Status: DC

## 2021-10-26 MED ORDER — SOD CITRATE-CITRIC ACID 500-334 MG/5ML PO SOLN: 30.0000 mL | ORAL | Status: DC

## 2021-10-26 MED ORDER — FENTANYL CITRATE (PF) 100 MCG/2ML IJ SOLN
INTRAMUSCULAR | Status: DC | PRN
  Administered 2021-10-26 (×2): 50 ug via INTRAVENOUS

## 2021-10-26 MED ORDER — ACETAMINOPHEN 500 MG PO TABS
1000.0000 mg | ORAL_TABLET | ORAL | Status: AC
  Administered 2021-10-26: 1000 mg via ORAL

## 2021-10-26 MED ORDER — PROPOFOL 10 MG/ML IV BOLUS
INTRAVENOUS | Status: AC
  Filled 2021-10-26: qty 20

## 2021-10-26 MED ORDER — OXYCODONE HCL 5 MG PO TABS: 5.0000 mg | ORAL_TABLET | Freq: Once | ORAL | Status: DC | PRN

## 2021-10-26 MED ORDER — FENTANYL CITRATE (PF) 100 MCG/2ML IJ SOLN: 25.0000 ug | INTRAMUSCULAR | Status: DC | PRN

## 2021-10-26 MED ORDER — FENTANYL CITRATE (PF) 100 MCG/2ML IJ SOLN
INTRAMUSCULAR | Status: AC
  Filled 2021-10-26: qty 2

## 2021-10-26 MED ORDER — SODIUM CHLORIDE 0.9 % IR SOLN
Status: DC | PRN
  Administered 2021-10-26: 3000 mL

## 2021-10-26 MED ORDER — SODIUM CHLORIDE 0.9 % IV SOLN: INTRAVENOUS | Status: DC

## 2021-10-26 SURGICAL SUPPLY — 17 items
GLOVE BIO SURGEON STRL SZ 6 (GLOVE) ×1 IMPLANT
GLOVE BIO SURGEON STRL SZ7.5 (GLOVE) ×2 IMPLANT
GLOVE BIOGEL PI IND STRL 6 (GLOVE) IMPLANT
GLOVE BIOGEL PI IND STRL 7.0 (GLOVE) IMPLANT
GLOVE BIOGEL PI INDICATOR 6 (GLOVE) ×1
GLOVE BIOGEL PI INDICATOR 7.0 (GLOVE) ×1
GLOVE SURG UNDER POLY LF SZ7 (GLOVE) ×2 IMPLANT
GOWN STRL REUS W/ TWL LRG LVL3 (GOWN DISPOSABLE) ×1 IMPLANT
GOWN STRL REUS W/ TWL XL LVL3 (GOWN DISPOSABLE) ×1 IMPLANT
GOWN STRL REUS W/TWL LRG LVL3 (GOWN DISPOSABLE) ×3 IMPLANT
GOWN STRL REUS W/TWL XL LVL3 (GOWN DISPOSABLE) ×2
IV NS IRRIG 3000ML ARTHROMATIC (IV SOLUTION) ×1 IMPLANT
KIT PROCEDURE FLUENT (KITS) ×2 IMPLANT
PACK VAGINAL MINOR WOMEN LF (CUSTOM PROCEDURE TRAY) ×2 IMPLANT
PAD OB MATERNITY 4.3X12.25 (PERSONAL CARE ITEMS) ×2 IMPLANT
SOL PREP POV-IOD 4OZ 10% (MISCELLANEOUS) ×1 IMPLANT
TOWEL OR 17X26 10 PK STRL BLUE (TOWEL DISPOSABLE) ×1 IMPLANT

## 2021-10-26 NOTE — Transfer of Care (Signed)
Immediate Anesthesia Transfer of Care Note ? ?Patient: Dorothy Harmon ? ?Procedure(s) Performed: DILATATION AND CURETTAGE /HYSTEROSCOPY (Vagina ) ? ?Patient Location: PACU ? ?Anesthesia Type:General ? ?Level of Consciousness: drowsy and patient cooperative ? ?Airway & Oxygen Therapy: Patient Spontanous Breathing ? ?Post-op Assessment: Report given to RN and Post -op Vital signs reviewed and stable ? ?Post vital signs: Reviewed and stable ? ?Last Vitals:  ?Vitals Value Taken Time  ?BP 162/66 10/26/21 1510  ?Temp 36.6 ?C 10/26/21 1510  ?Pulse 75 10/26/21 1510  ?Resp 13 10/26/21 1510  ?SpO2 98 % 10/26/21 1510  ?Vitals shown include unvalidated device data. ? ?Last Pain:  ?Vitals:  ? 10/26/21 1510  ?TempSrc:   ?PainSc: 0-No pain  ?   ? ?Patients Stated Pain Goal: 0 (10/26/21 1008) ? ?Complications: No notable events documented. ?

## 2021-10-26 NOTE — Anesthesia Preprocedure Evaluation (Addendum)
Anesthesia Evaluation  ?Patient identified by MRN, date of birth, ID band ?Patient awake ? ? ? ?Reviewed: ?Allergy & Precautions, NPO status , Patient's Chart, lab work & pertinent test results ? ?History of Anesthesia Complications ?Negative for: history of anesthetic complications ? ?Airway ?Mallampati: I ? ?TM Distance: >3 FB ?Neck ROM: Full ? ? ? Dental ? ?(+) Dental Advisory Given ?  ?Pulmonary ?sleep apnea (does not use CPAP) ,  ?  ?breath sounds clear to auscultation ? ? ? ? ? ? Cardiovascular ?hypertension, Pt. on medications ?(-) angina ?Rhythm:Regular Rate:Normal ? ?'20 ECHO:  ?Left ventricle cavity is normal in size. Moderate concentric LVH. Normal LVF with EF 55%. Normal global wall motion.  grade I (impaired) diastolic dysfunction. Calculated EF 55%. Trileaflet aortic valve. Trace AI. Mild TR.  ?  ?Neuro/Psych ?Depression CVA (memory deficit), Residual Symptoms   ? 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 ?  ?Musculoskeletal ? ?(+) Arthritis , Fibromyalgia - ? Abdominal ?  ?Peds ? Hematology ?negative hematology ROS ?(+)   ?Anesthesia Other Findings ? ? Reproductive/Obstetrics ? ?  ? ? ? ? ? ? ? ? ? ? ? ? ? ?  ?  ? ? ? ? ? ? ? ?Anesthesia Physical ?Anesthesia Plan ? ?ASA: 3 ? ?Anesthesia Plan: General  ? ?Post-op Pain Management: Tylenol PO (pre-op)*  ? ?Induction: Intravenous ? ?PONV Risk Score and Plan: 3 and Ondansetron, Dexamethasone and Treatment may vary due to age or medical condition ? ?Airway Management Planned: LMA ? ?Additional Equipment: None ? ?Intra-op Plan:  ? ?Post-operative Plan:  ? ?Informed Consent: I have reviewed the patients History and Physical, chart, labs and discussed the procedure including the risks, benefits and alternatives for the proposed anesthesia with the patient or authorized representative who has indicated his/her  understanding and acceptance.  ? ? ? ?Dental advisory given ? ?Plan Discussed with: CRNA and Surgeon ? ?Anesthesia Plan Comments:   ? ? ? ? ? ?Anesthesia Quick Evaluation ? ?

## 2021-10-26 NOTE — Anesthesia Procedure Notes (Signed)
Procedure Name: LMA Insertion ?Date/Time: 10/26/2021 2:32 PM ?Performed by: Georgeanne Nim, CRNA ?Pre-anesthesia Checklist: Patient identified, Patient being monitored, Emergency Drugs available, Timeout performed and Suction available ?Patient Re-evaluated:Patient Re-evaluated prior to induction ?Oxygen Delivery Method: Circle System Utilized ?Preoxygenation: Pre-oxygenation with 100% oxygen ?Induction Type: IV induction ?Ventilation: Mask ventilation without difficulty ?LMA: LMA inserted ?LMA Size: 4.0 ?Number of attempts: 1 ?Placement Confirmation: positive ETCO2 and breath sounds checked- equal and bilateral ?Tube secured with: Tape ? ? ? ? ?

## 2021-10-26 NOTE — Op Note (Signed)
PREOPERATIVE DIAGNOSIS:  Thicken endometrium ?POSTOPERATIVE DIAGNOSIS: The same, probable endometrial polyp ?PROCEDURE: Hysteroscopy, Dilation and Curettage. ?SURGEON:  Arlina Robes, MD ? ? ?INDICATIONS: 71 y.o. No obstetric history on file.  here for scheduled surgery for the aforementioned diagnoses.   Risks of surgery were discussed with the patient including but not limited to: bleeding which may require transfusion; infection which may require antibiotics; injury to uterus or surrounding organs; intrauterine scarring which may impair future fertility; need for additional procedures including laparotomy or laparoscopy; and other postoperative/anesthesia complications. Written informed consent was obtained.   ? ?FINDINGS:  A 8 week size uterus.  Endometrial polyp.  Unable to see ostia ? ?ANESTHESIA:   GETA ?INTRAVENOUS FLUIDS:  As recorded ?FLUID DEFICITS: 290 cc NS ?ESTIMATED BLOOD LOSS:  Less than 20 ml ?SPECIMENS: Endometrial curettings and polyp sent to pathology ?COMPLICATIONS:  None immediate. ? ?PROCEDURE DETAILS:  The patient received intravenous antibiotics while in the preoperative area.  She was then taken to the operating room where general anesthesia was administered and was found to be adequate.  After an adequate timeout was performed, she was placed in the dorsal lithotomy position and examined; then prepped and draped in the sterile manner.   Her bladder was catheterized for an unmeasured amount of clear, yellow urine. A speculum was then placed in the patient's vagina and a single tooth tenaculum was applied to the anterior lip of the cervix.  The cervix was sounded to 8 cm and dilated manually with metal dilators to accommodate the 5 mm diagnostic hysteroscope.  Once the cervix was dilated, the hysteroscope was inserted under direct visualization using NS as a suspension medium.  The uterine cavity was carefully examined with the findings as noted above.   After further careful visualization  of the uterine cavity, the hysteroscope was removed under direct visualization.  A sharp curettage was then performed and endometrial polyp was removed. Curettage was repeat for endometrial curettings.  The tenaculum was removed from the anterior lip of the cervix and the vaginal speculum was removed after noting good hemostasis.  The patient tolerated the procedure well and was taken to the recovery area awake, extubated and in stable condition. ? ?The patient will be discharged to home as per PACU criteria.  Routine postoperative instructions given.  She was prescribed Percocet and Ibuprofen   She will follow up in the clinic on 3-4 weeks   for postoperative evaluation. ?  ?

## 2021-10-26 NOTE — Discharge Instructions (Signed)

## 2021-10-26 NOTE — Interval H&P Note (Signed)
History and Physical Interval Note: ? ?10/26/2021 ?2:17 PM ? ?Dorothy Harmon  has presented today for surgery, with the diagnosis of PMB ?Polyp.  The various methods of treatment have been discussed with the patient and family. After consideration of risks, benefits and other options for treatment, the patient has consented to  Procedure(s): ?DILATATION AND CURETTAGE /HYSTEROSCOPY (N/A) as a surgical intervention.  The patient's history has been reviewed, patient examined, no change in status, stable for surgery.  I have reviewed the patient's chart and labs.  Questions were answered to the patient's satisfaction.   ? ? ?[name] ? ? ?

## 2021-10-26 NOTE — Anesthesia Postprocedure Evaluation (Signed)
Anesthesia Post Note ? ?Patient: Dorothy Harmon ? ?Procedure(s) Performed: DILATATION AND CURETTAGE /HYSTEROSCOPY (Vagina ) ? ?  ? ?Patient location during evaluation: PACU ?Anesthesia Type: General ?Level of consciousness: awake and alert and oriented ?Pain management: pain level controlled ?Vital Signs Assessment: post-procedure vital signs reviewed and stable ?Respiratory status: spontaneous breathing, nonlabored ventilation and respiratory function stable ?Cardiovascular status: blood pressure returned to baseline and stable ?Postop Assessment: no apparent nausea or vomiting ?Anesthetic complications: no ? ? ?No notable events documented. ? ?Last Vitals:  ?Vitals:  ? 10/26/21 1530 10/26/21 1556  ?BP: (!) 165/54 138/65  ?Pulse: 70 61  ?Resp: 15 14  ?Temp:  36.4 ?C  ?SpO2: 97% 100%  ?  ?Last Pain:  ?Vitals:  ? 10/26/21 1556  ?TempSrc:   ?PainSc: 0-No pain  ? ? ?  ?  ?  ?  ?  ?  ? ?Irfan Veal A. ? ? ? ? ?

## 2021-10-27 ENCOUNTER — Encounter (HOSPITAL_BASED_OUTPATIENT_CLINIC_OR_DEPARTMENT_OTHER): Payer: Self-pay | Admitting: Obstetrics and Gynecology

## 2021-10-27 LAB — SURGICAL PATHOLOGY

## 2021-10-28 IMAGING — US US PELVIS COMPLETE WITH TRANSVAGINAL
1 series · 13 of 25 positions shown · non-contrast
Comparison: Renal ultrasound 12/02/2020

CLINICAL DATA: Endometrial thickening on ultrasound

EXAM:
TRANSABDOMINAL AND TRANSVAGINAL ULTRASOUND OF PELVIS
TECHNIQUE: Both transabdominal and transvaginal ultrasound examinations of the
pelvis were performed. Transabdominal technique was performed for
global imaging of the pelvis including uterus, ovaries, adnexal
regions, and pelvic cul-de-sac. It was necessary to proceed with
endovaginal exam following the transabdominal exam to visualize the
endometrium and ovaries.

[Series 1: us pelvis complete with transvaginal · 0.23mm/px · 13 of 98 slices shown]
[im 1/98]
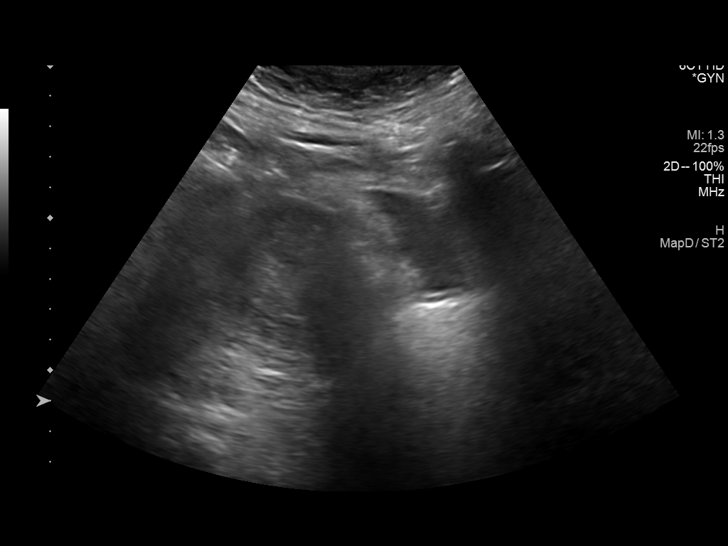
[im 9/98]
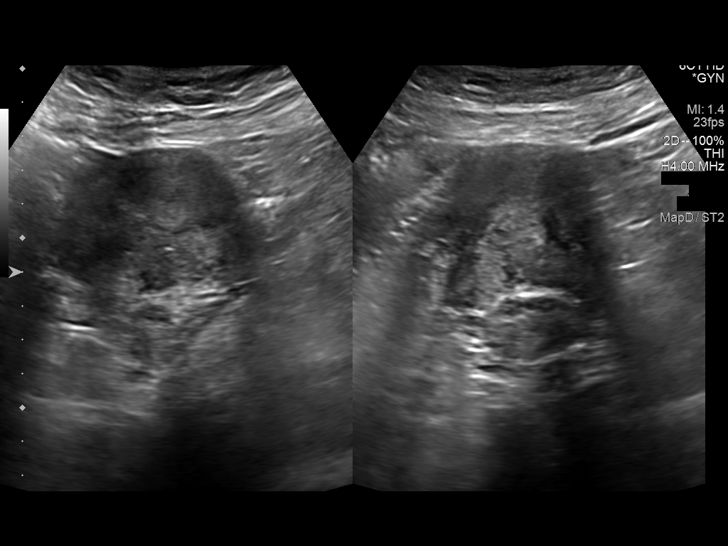
[im 17/98]
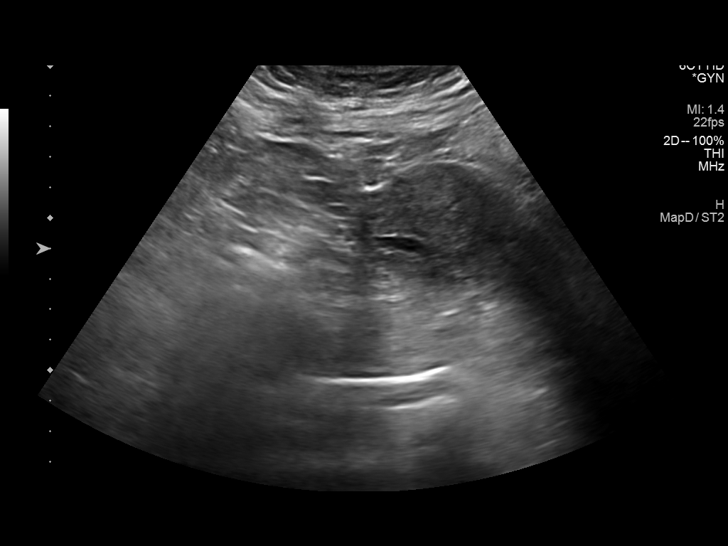
[im 25/98]
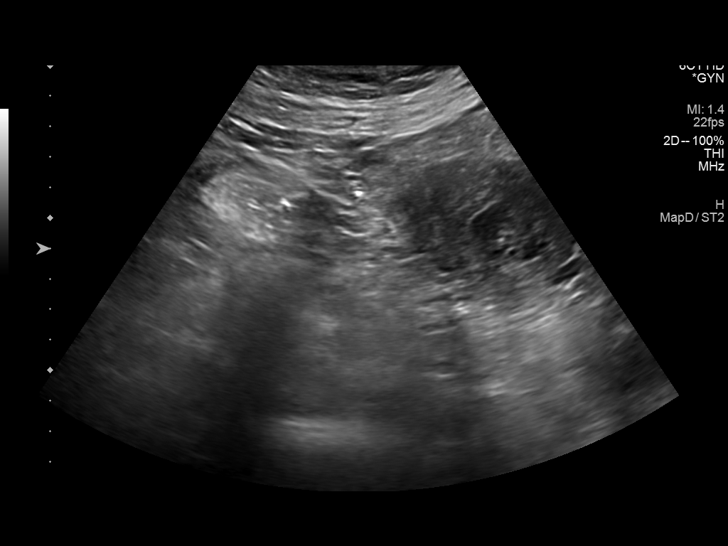
[im 33/98]
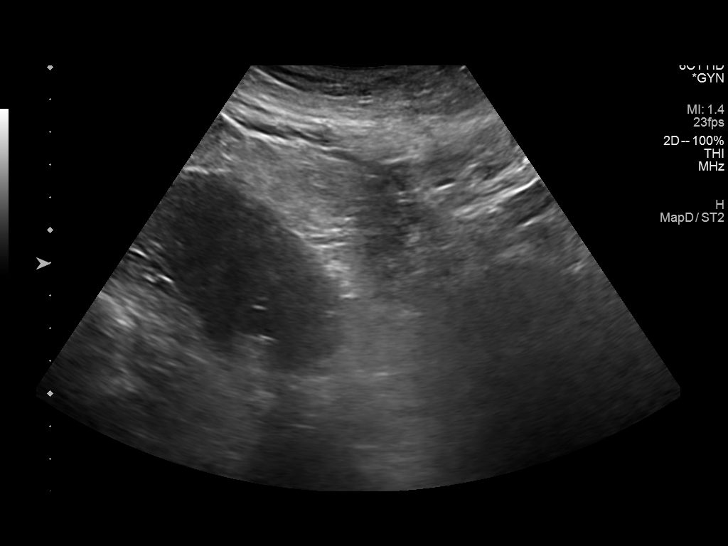
[im 41/98]
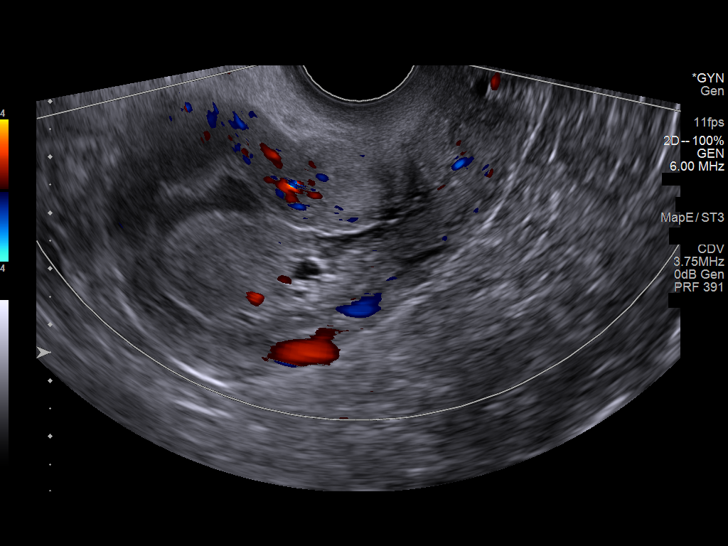
[im 49/98]
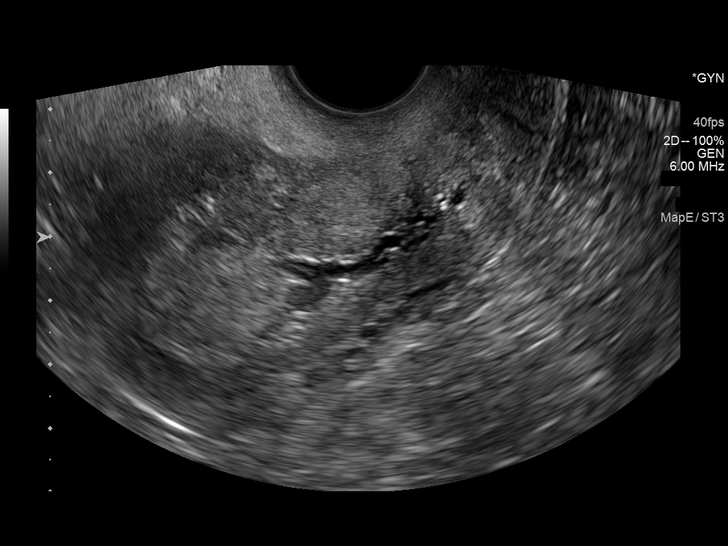
[im 57/98]
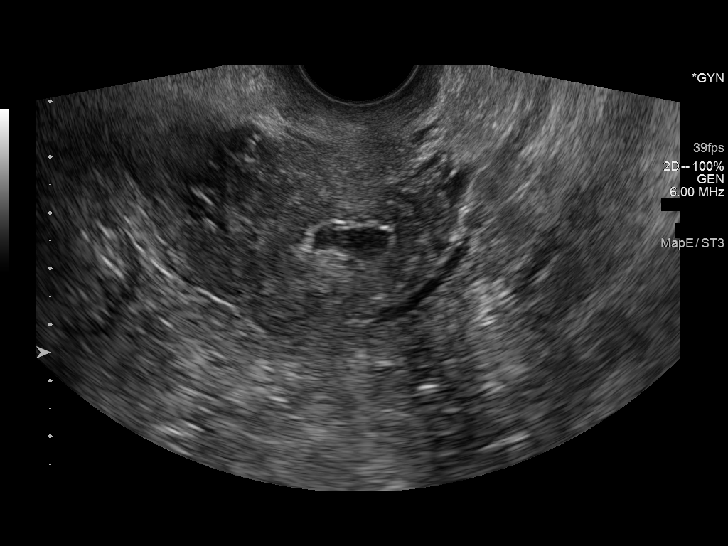
[im 65/98]
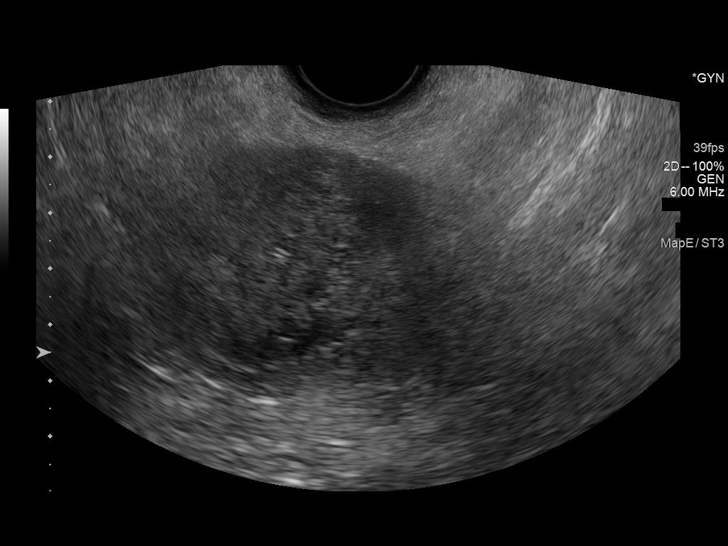
[im 73/98]
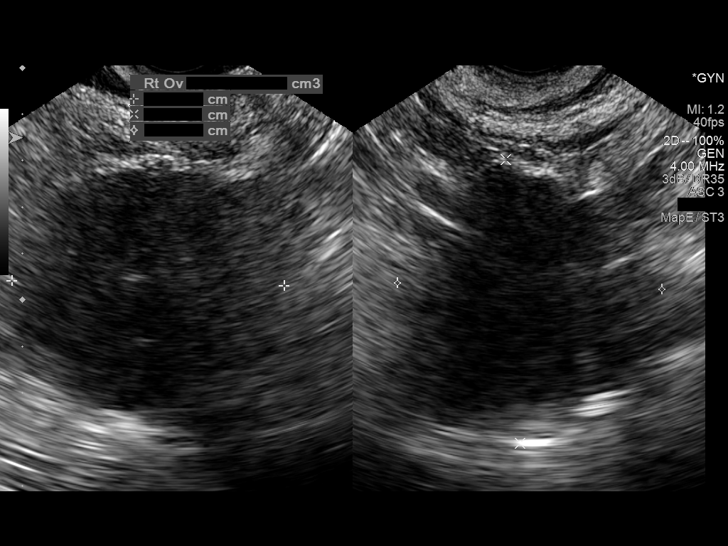
[im 81/98]
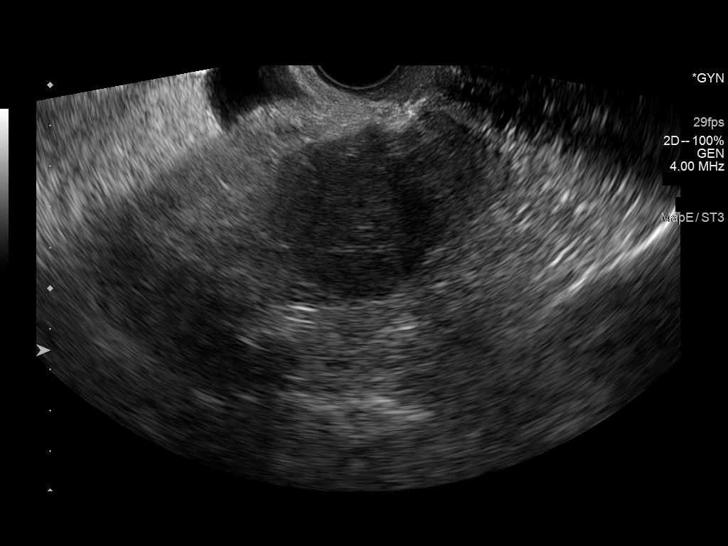
[im 89/98]
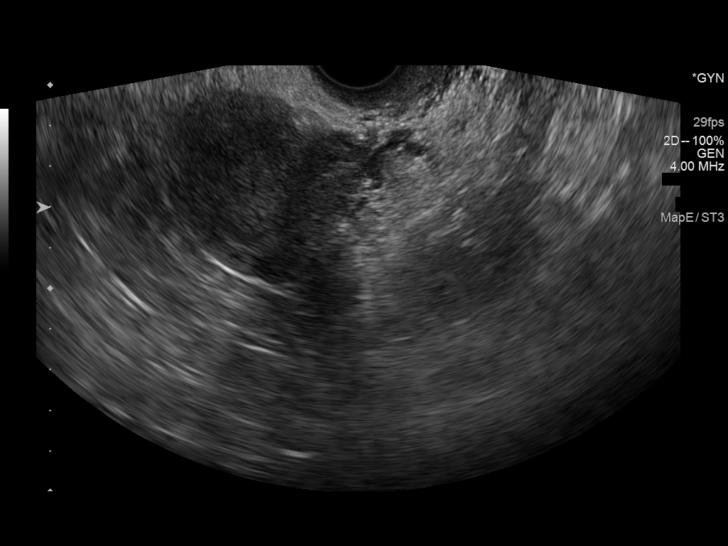
[im 98/98]
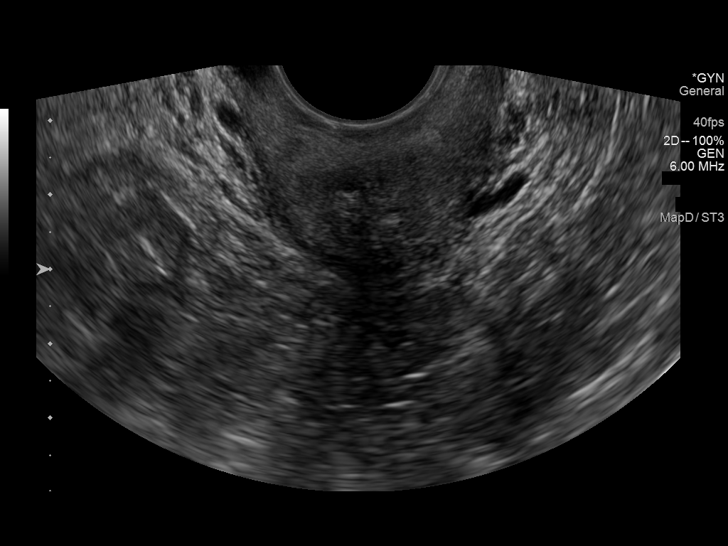

[13 of 25 positions shown; findings below may reference images not displayed]

FINDINGS: Uterus

Measurements: 8.8 x 5.4 x 6.9 cm = volume: 174 mL. Anteverted.
Heterogeneous myometrium. Nabothian cysts at cervix. Heterogeneous
mass identified adjacent to the RIGHT lateral aspect of the uterus,
question exophytic leiomyoma 6.0 x 6.2 x 6.0 cm.

Endometrium

Thickness: 5 mm. Endometrial canal distended by a small amount of
endometrial fluid as well as a large focal mass at the upper uterine
segment measuring 3.0 x 2.2 x 3.4 cm consistent with polyp or tumor.
This mass demonstrates internal blood flow on color Doppler imaging.

Right ovary

Not definitely visualized, likely obscured by bowel gas

Left ovary

Not visualized, likely obscured by bowel

Other findings

No free pelvic fluid.  No adnexal masses.
IMPRESSION: Abnormal endometrial complex distended by fluid and a 3.4 cm
diameter endometrial mass which demonstrates internal blood flow on
color Doppler imaging, question polyp versus neoplasm; endometrial
sampling is indicated to exclude carcinoma. If results are benign,
sonohysterogram should be considered for focal lesion work-up prior
to hysteroscopy. (Ref: Radiological Reasoning: Algorithmic Workup of
Abnormal Vaginal Bleeding with Endovaginal Sonography and
Sonohysterography. AJR [id number]; 191:S68-73)

These results will be called to the ordering clinician or
representative by the Radiologist Assistant, and communication
documented in the PACS or [REDACTED].

## 2021-11-05 ENCOUNTER — Ambulatory Visit (INDEPENDENT_AMBULATORY_CARE_PROVIDER_SITE_OTHER): Payer: Medicare Other | Admitting: Physician Assistant

## 2021-11-05 VITALS — BP 141/80 | HR 100 | Temp 97.6°F | Resp 20 | Ht 64.0 in | Wt 160.0 lb

## 2021-11-05 DIAGNOSIS — N186 End stage renal disease: Secondary | ICD-10-CM

## 2021-11-05 DIAGNOSIS — Z992 Dependence on renal dialysis: Secondary | ICD-10-CM

## 2021-11-05 NOTE — Progress Notes (Signed)
    Postoperative Access Visit   History of Present Illness   Dorothy Harmon is a 71 y.o. year old female who presents for postoperative follow-up for: left second stage basilic vein transposition (Date: 09/23/21).  The patient's wounds are healed.  The patient denies steal symptoms.  The patient is able to complete their activities of daily living.  She is currently dialyzing on a Monday Wednesday Friday schedule at the [facility] location via right IJ TDC.  ESRD is managed by Dr. Johnney Ou.  Physical Examination   Vitals:   11/05/21 0821  BP: (!) 141/80  Pulse: 100  Resp: 20  Temp: 97.6 F (36.4 C)  TempSrc: Temporal  SpO2: 98%  Weight: 160 lb (72.6 kg)  Height: 5\' 4"  (1.626 m)   Body mass index is 27.46 kg/m.  left arm Incisions are healed, palpable radial pulse, hand grip is 5/5, sensation in digits is intact, palpable thrill, bruit can be auscultated     Medical Decision Making   Dorothy Harmon is a 71 y.o. year old female who presents s/p left second stage basilic vein transposition  Patent L arm basilic vein fistula without signs or symptoms of steal syndrome The patient's access will be ready for use Monday 11/08/21 The patient's tunneled dialysis catheter can be removed when Nephrology is comfortable with the performance of the fistula The patient may follow up on a prn basis   Dagoberto Ligas PA-C [facility] Office: [phone]  Clinic MD: Virl Cagey

## 2021-11-08 ENCOUNTER — Telehealth: Payer: Self-pay | Admitting: *Deleted

## 2021-11-08 NOTE — Telephone Encounter (Signed)
Pt called to office stating she is still having heavy bleeding after recent procedure. Pt is also on dialysis and states she is feeling very tired/weak.  Pt advised to push PO fluids, take medication as given for any pain she may have.   Reviewed with Dr Elly Modena, in office, pt may take Megace as previously given and keep follow up appt.  Pt made aware of medication recommendation, advised to contact office if bleeding continues or worsens.

## 2021-11-18 ENCOUNTER — Telehealth: Payer: Self-pay

## 2021-11-23 ENCOUNTER — Ambulatory Visit (INDEPENDENT_AMBULATORY_CARE_PROVIDER_SITE_OTHER): Payer: Medicare Other | Admitting: Obstetrics and Gynecology

## 2021-11-23 ENCOUNTER — Encounter: Payer: Self-pay | Admitting: Obstetrics and Gynecology

## 2021-11-23 VITALS — BP 105/70 | HR 128 | Wt 157.0 lb

## 2021-11-23 DIAGNOSIS — N939 Abnormal uterine and vaginal bleeding, unspecified: Secondary | ICD-10-CM

## 2021-11-23 DIAGNOSIS — N8501 Benign endometrial hyperplasia: Secondary | ICD-10-CM | POA: Insufficient documentation

## 2021-11-23 DIAGNOSIS — N950 Postmenopausal bleeding: Secondary | ICD-10-CM

## 2021-11-23 NOTE — Progress Notes (Signed)
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 Reviewed with pt No bleeding but still taking Megace   PE AF VSS Lungs clear Heart RRR Abd soft + BS   A/P Complex endometrial hyperplasia without atypia  Discussed treatment options. Pt not the ideal surgerical candidate. Medical treatment options reviewed. Following discussion, pt desires to proceed with IUD placement. Reviewed with pt. Will schedule appt for insertion.

## 2021-11-25 ENCOUNTER — Encounter: Payer: Self-pay | Admitting: Obstetrics and Gynecology

## 2021-11-25 ENCOUNTER — Ambulatory Visit (INDEPENDENT_AMBULATORY_CARE_PROVIDER_SITE_OTHER): Payer: Medicare Other | Admitting: Obstetrics and Gynecology

## 2021-11-25 VITALS — BP 120/72 | HR 103 | Wt 156.0 lb

## 2021-11-25 DIAGNOSIS — N8500 Endometrial hyperplasia, unspecified: Secondary | ICD-10-CM

## 2021-11-25 DIAGNOSIS — N939 Abnormal uterine and vaginal bleeding, unspecified: Secondary | ICD-10-CM | POA: Diagnosis not present

## 2021-11-25 DIAGNOSIS — Z30014 Encounter for initial prescription of intrauterine contraceptive device: Secondary | ICD-10-CM

## 2021-11-25 DIAGNOSIS — N8501 Benign endometrial hyperplasia: Secondary | ICD-10-CM

## 2021-11-25 MED ORDER — LEVONORGESTREL 20 MCG/DAY IU IUD
1.0000 | INTRAUTERINE_SYSTEM | Freq: Once | INTRAUTERINE | Status: AC
  Administered 2021-11-25: 1 via INTRAUTERINE

## 2021-11-25 MED ORDER — LEVONORGESTREL 20.1 MCG/DAY IU IUD: 1.0000 | INTRAUTERINE_SYSTEM | Freq: Once | INTRAUTERINE | Status: DC

## 2021-11-25 NOTE — Progress Notes (Signed)
    GYNECOLOGY CLINIC PROCEDURE NOTE  Dorothy Harmon is a 71 y.o. No obstetric history on file. here for Mirena IUD insertion due to complex endometrial hyperplasia without atypia  IUD Insertion Procedure Note Patient identified, informed consent performed, consent signed.   Discussed risks of irregular bleeding, cramping, infection, malpositioning or misplacement of the IUD outside the uterus which may require further procedure such as laparoscopy. Time out was performed.  Urine pregnancy test negative.  Speculum placed in the vagina.  Cervix visualized.  Cleaned with Betadine x 2.  Grasped anteriorly with a single tooth tenaculum.  Uterus sounded to 8 cm.  Mirena IUD placed per manufacturer's recommendations.  Strings trimmed to 3 cm. Tenaculum was removed, good hemostasis noted.  Patient tolerated procedure well.   Patient was given post-procedure instructions.  She will return in 3 months for [facility]  Arlina Robes MD, [place] Attending [place] for [facility], [place]

## 2021-11-25 NOTE — Patient Instructions (Signed)

## 2021-11-25 NOTE — Addendum Note (Signed)
Addended by: Lewie Loron D on: 11/25/2021 02:33 PM   Modules accepted: Orders

## 2021-12-07 ENCOUNTER — Telehealth: Payer: Self-pay

## 2021-12-07 NOTE — Telephone Encounter (Signed)
Patient called and left message on triage vm.  Attempted to return call. No answer. Unable to leave vm.

## 2021-12-24 ENCOUNTER — Emergency Department (HOSPITAL_COMMUNITY)
Admission: EM | Admit: 2021-12-24 | Discharge: 2021-12-24 | Disposition: A | Discharge status: Home / Self Care | Payer: Medicare Other | Attending: Emergency Medicine | Admitting: Emergency Medicine

## 2021-12-24 DIAGNOSIS — R42 Dizziness and giddiness: Secondary | ICD-10-CM | POA: Diagnosis present

## 2021-12-24 DIAGNOSIS — Z794 Long term (current) use of insulin: Secondary | ICD-10-CM | POA: Insufficient documentation

## 2021-12-24 DIAGNOSIS — Z992 Dependence on renal dialysis: Secondary | ICD-10-CM | POA: Diagnosis not present

## 2021-12-24 DIAGNOSIS — E1122 Type 2 diabetes mellitus with diabetic chronic kidney disease: Secondary | ICD-10-CM | POA: Insufficient documentation

## 2021-12-24 DIAGNOSIS — D649 Anemia, unspecified: Secondary | ICD-10-CM | POA: Insufficient documentation

## 2021-12-24 DIAGNOSIS — I509 Heart failure, unspecified: Secondary | ICD-10-CM | POA: Insufficient documentation

## 2021-12-24 DIAGNOSIS — I130 Hypertensive heart and chronic kidney disease with heart failure and stage 1 through stage 4 chronic kidney disease, or unspecified chronic kidney disease: Secondary | ICD-10-CM | POA: Diagnosis not present

## 2021-12-24 DIAGNOSIS — N189 Chronic kidney disease, unspecified: Secondary | ICD-10-CM | POA: Insufficient documentation

## 2021-12-24 DIAGNOSIS — Z7902 Long term (current) use of antithrombotics/antiplatelets: Secondary | ICD-10-CM | POA: Insufficient documentation

## 2021-12-24 LAB — CBC
HCT: 28.7 % — ABNORMAL LOW (ref 36.0–46.0)
Hemoglobin: 9.2 g/dL — ABNORMAL LOW (ref 12.0–15.0)
MCH: 29.3 pg (ref 26.0–34.0)
MCHC: 32.1 g/dL (ref 30.0–36.0)
MCV: 91.4 fL (ref 80.0–100.0)
Platelets: 274 10*3/uL (ref 150–400)
RBC: 3.14 MIL/uL — ABNORMAL LOW (ref 3.87–5.11)
RDW: 14.7 % (ref 11.5–15.5)
WBC: 8.9 10*3/uL (ref 4.0–10.5)
nRBC: 0 % (ref 0.0–0.2)

## 2021-12-24 LAB — BASIC METABOLIC PANEL
Anion gap: 17 — ABNORMAL HIGH (ref 5–15)
BUN: 58 mg/dL — ABNORMAL HIGH (ref 8–23)
CO2: 26 mmol/L (ref 22–32)
Calcium: 9.3 mg/dL (ref 8.9–10.3)
Chloride: 98 mmol/L (ref 98–111)
Creatinine, Ser: 6.83 mg/dL — ABNORMAL HIGH (ref 0.44–1.00)
GFR, Estimated: 6 mL/min — ABNORMAL LOW (ref >60)
Glucose, Bld: 132 mg/dL — ABNORMAL HIGH (ref 70–99)
Potassium: 3.8 mmol/L (ref 3.5–5.1)
Sodium: 141 mmol/L (ref 135–145)

## 2021-12-24 MED ORDER — MECLIZINE HCL 12.5 MG PO TABS: 12.5000 mg | ORAL_TABLET | Freq: Every day | ORAL | 0 refills | Status: AC | PRN

## 2021-12-24 MED ORDER — MECLIZINE HCL 25 MG PO TABS
12.5000 mg | ORAL_TABLET | Freq: Once | ORAL | Status: AC
  Administered 2021-12-24: 12.5 mg via ORAL
  Filled 2021-12-24: qty 1

## 2021-12-24 NOTE — ED Triage Notes (Signed)
BIB [facility] from home c/o dizziness intermittent since yesterday feels like the room is spinning. Caused her to fall yesterday denies any injuries of LOC. No other complaints and no neuro deficits

## 2021-12-24 NOTE — ED Notes (Signed)
Junie Panning daughter [phone] requesting an update on the patient

## 2021-12-24 NOTE — Discharge Instructions (Addendum)
Try the epley maneuver exercises at home to help with the vertigo.  It will often make the symptoms return while you are doing it but should help decrease the vertigo.  Follow up with your primary doctor or consider seeing your neurologist if the symptoms are not improving.  REturn to the ED for worsening symptoms  Follow up with your primary care doctor to recheck the anemia.  Stop the meclizine if you start experiencing dry mouth, palpitations

## 2021-12-24 NOTE — ED Notes (Signed)
Patient initially complains of unsteadiness when changing from sit to standing for ambulation however patient ambulated with no issue, steady gait, no concerns of dizziness while walking. Dr. Tomi Bamberger notified.

## 2021-12-24 NOTE — ED Provider Notes (Addendum)
[place] EMERGENCY DEPARTMENT Provider Note   CSN: [id number] Arrival date & time: 12/24/21  0645     History  Chief Complaint  Patient presents with   Dizziness    Dorothy Harmon is a 71 y.o. female.   Dizziness  Patient has a history of diabetes hypertension hypercholesterolemia, fibromyalgia, stroke, CHF, obstructive sleep apnea, chronic kidney disease on dialysis.  Patient states she presents to the ED for episodes of vertigo.  Patient started having symptoms yesterday.  She noticed when she leaned over she suddenly felt the room spinning and she ended up following.  She has denies injuring herself and was able to get up.  She has had vertigo before and carillon throughout the rest of her day.  She has had some intermittent spells since then.  This morning when she woke up again and turned over she had sudden onset of the spinning again.  No trouble with her vision.  No trouble with her speech.  She is not having any numbness or weakness.  Patient states she has had a stroke before and this feels nothing like that.    Home Medications Prior to Admission medications   Medication Sig Start Date End Date Taking? Authorizing Provider  meclizine (ANTIVERT) 12.5 MG tablet Take 1 tablet (12.5 mg total) by mouth daily as needed for up to 5 days for dizziness. 12/24/21 12/29/21 Yes Dorie Rank, MD  acetaminophen (TYLENOL) 500 MG tablet Take 1,000 mg by mouth every 6 (six) hours as needed for moderate pain or mild pain.    [provider]  clopidogrel (PLAVIX) 75 MG tablet Take 1 tablet (75 mg total) by mouth every evening. 10/06/21   Pokhrel, L[place], MD  insulin glargine, 2 Unit Dial, (TOUJEO MAX SOLOSTAR) 300 UNIT/ML Solostar Pen Inject 34 Units into the skin at bedtime.    [provider]  oxyCODONE (OXY IR/ROXICODONE) 5 MG immediate release tablet Take 1 tablet (5 mg total) by mouth every 6 (six) hours as needed for severe pain. 10/26/21   [name],  MD  rosuvastatin (CRESTOR) 20 MG tablet Take 1 tablet (20 mg total) by mouth daily. Patient taking differently: Take 20 mg by mouth every evening. 08/24/20   Patwardhan, Reynold Bowen, MD      Allergies    Ace inhibitors, Codeine, Morphine and related, and Penicillins    Review of Systems   Review of Systems  Neurological:  Positive for dizziness.    Physical Exam Updated Vital Signs BP (!) 129/57   Pulse 67   Temp 98.1 F (36.7 C) (Oral)   Resp 15   SpO2 97%  Physical Exam Vitals and nursing note reviewed.  Constitutional:      Appearance: She is well-developed. She is not ill-appearing or diaphoretic.  HENT:     Head: Normocephalic and atraumatic.     Right Ear: External ear normal.     Left Ear: External ear normal.  Eyes:     General: No scleral icterus.       Right eye: No discharge.        Left eye: No discharge.     Conjunctiva/sclera: Conjunctivae normal.  Neck:     Trachea: No tracheal deviation.  Cardiovascular:     Rate and Rhythm: Normal rate and regular rhythm.  Pulmonary:     Effort: Pulmonary effort is normal. No respiratory distress.     Breath sounds: Normal breath sounds. No stridor. No wheezing or rales.  Abdominal:  General: Bowel sounds are normal. There is no distension.     Palpations: Abdomen is soft.     Tenderness: There is no abdominal tenderness. There is no guarding or rebound.  Musculoskeletal:        General: No tenderness.     Cervical back: Neck supple.  Skin:    General: Skin is warm and dry.     Findings: No rash.  Neurological:     Mental Status: She is alert and oriented to person, place, and time.     Cranial Nerves: No cranial nerve deficit.     Sensory: No sensory deficit.     Motor: No abnormal muscle tone or seizure activity.     Coordination: Coordination normal.     Comments: No pronator drift bilateral upper extrem, able to hold both legs off bed for 5 seconds, sensation intact in all extremities, no visual field cuts,  no left or right sided neglect, normal finger-nose exam bilaterally, no nystagmus noted  No facial droop, extraocular movements intact, tongue midline     ED Results / Procedures / Treatments   Labs (all labs ordered are listed, but only abnormal results are displayed) Labs Reviewed  CBC - Abnormal; Notable for the following components:      Result Value   RBC 3.14 (*)    Hemoglobin 9.2 (*)    HCT 28.7 (*)    All other components within normal limits  BASIC METABOLIC PANEL - Abnormal; Notable for the following components:   Glucose, Bld 132 (*)    BUN 58 (*)    Creatinine, Ser 6.83 (*)    GFR, Estimated 6 (*)    Anion gap 17 (*)    All other components within normal limits    EKG EKG Interpretation  Date/Time:  Friday December 24 2021 06:54:02 EDT Ventricular Rate:  84 PR Interval:  166 QRS Duration: 93 QT Interval:  396 QTC Calculation: 469 R Axis:   -28 Text Interpretation: Sinus rhythm Ventricular premature complex Borderline left axis deviation No significant change since last tracing Confirmed by Dorie Rank [phone]) on 12/24/2021 6:59:21 AM  Radiology No results found.  Procedures Procedures    Medications Ordered in ED Medications  meclizine (ANTIVERT) tablet 12.5 mg (12.5 mg Oral Given 12/24/21 0749)    ED Course/ Medical Decision Making/ A&P Clinical Course as of 12/24/21 0838  Fri Dec 24, 2021  9678 Basic metabolic panel(!) Consistent with CKD, no acute electrolyte abnormalities [JK]  0751 CBC(!) Hgb decreased from 1 month ago [JK]  0826 Patient feeling better.  Able to ambulate ,no signs of ataxia [JK]    Clinical Course User Index [JK] Dorie Rank, MD                           Medical Decision Making Differential diagnosis includes but not limited to anemia, electrolyte disturbance, vertigo, stroke, cardiac dysrhythmia  Problems Addressed: Anemia, unspecified type: chronic illness or injury Vertigo: acute illness or injury that poses a threat to  life or bodily functions  Amount and/or Complexity of Data Reviewed Labs: ordered. Decision-making details documented in ED Course.   Patient presented to the ED for evaluation of dizziness.  Patient history suggestive of benign positional vertigo.  At rest patient's symptoms have resolved but certain head positions do trigger her dizziness.  No focal neurologic deficits noted on exam.  Patient is able to ambulate.  Doubt acute stroke TIA.  Patient noted to  be anemic but denies any blood in her stool dark stools.  Does have history anemia related to her chronic kidney disease.  Do not think this is related to her acute symptoms today.  Recommend close outpatient follow-up  We will have patient try doing Epley maneuver exercises.  Physical therapy referral for her vertigo.  Cautious use of meclizine but will need to be careful with her chronic kidney disease. .  Evaluation and diagnostic testing in the emergency department does not suggest an emergent condition requiring admission or immediate intervention beyond what has been performed at this time.  The patient is safe for discharge and has been instructed to return immediately for worsening symptoms, change in symptoms or any other concerns.     Final Clinical Impression(s) / ED Diagnoses Final diagnoses:  Vertigo  Anemia, unspecified type    Rx / DC Orders ED Discharge Orders          Ordered    meclizine (ANTIVERT) 12.5 MG tablet  Daily PRN        12/24/21 0834    Ambulatory referral to Physical Therapy       Comments: For acute bppv   12/24/21 0834               Dorie Rank, MD 12/24/21 [phone]

## 2022-04-26 NOTE — Telephone Encounter (Signed)
CALLED TO CONFIRM APPT ON 6/6 AT 855. STATED SHE WILL BE HERE FOR THE APPT

## 2022-05-31 IMAGING — CT CT ABD-PELV W/O CM
2 of 4 series · 16 of 46 positions shown, 18 images · non-contrast
Comparison: Pelvic ultrasound 01/07/2021. CT abdomen and pelvis
06/09/2005.

CLINICAL DATA: Flank pain, kidney stone suspected. Back pain.
Vaginal bleeding.



[Series 3: abd/ pelvis 5.0 i30f 2 · axial · 0.95mm/px · z∈[-367,+93]mm · 13 of 102 slices shown, 15 images]
[im 5/102  soft-tissue]
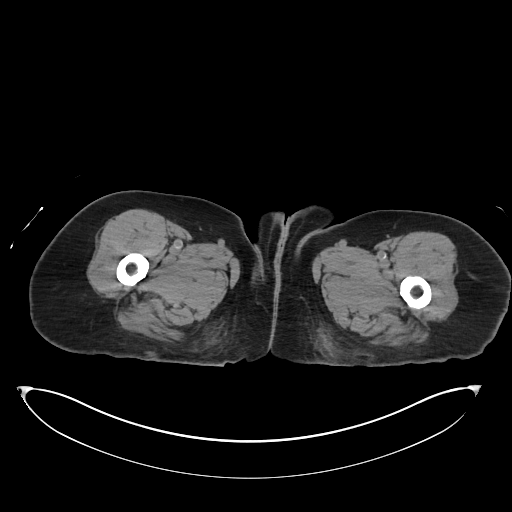
[im 5/102  bone]
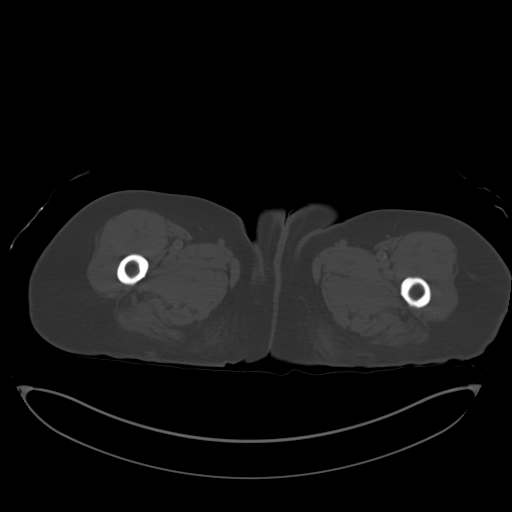
[im 14/102  soft-tissue]
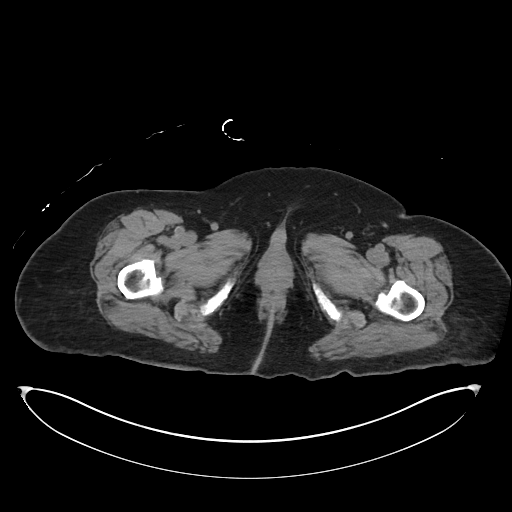
[im 23/102  soft-tissue]
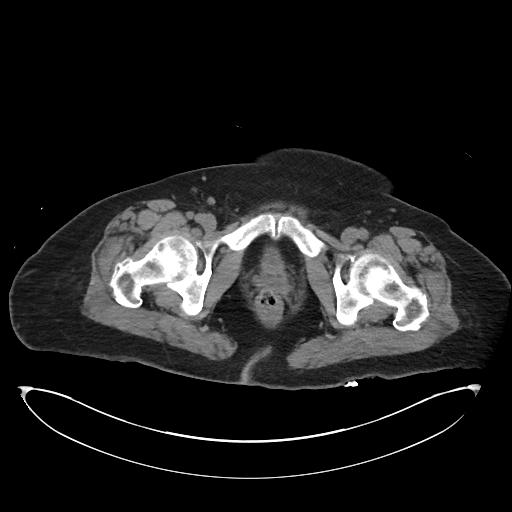
[im 28/102  soft-tissue]
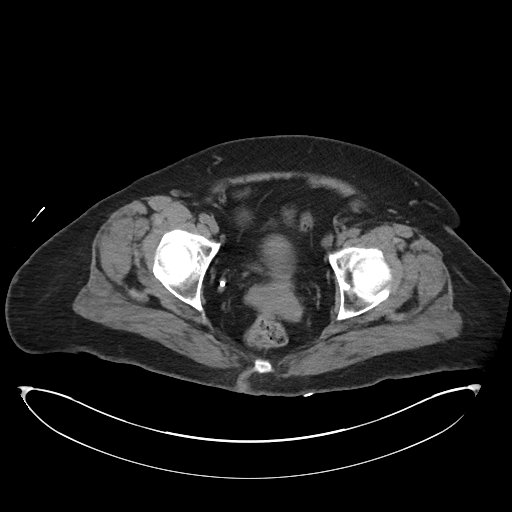
[im 37/102  soft-tissue]
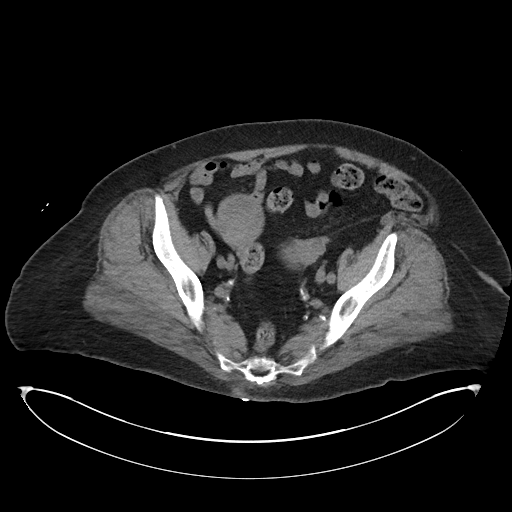
[im 42/102  soft-tissue]
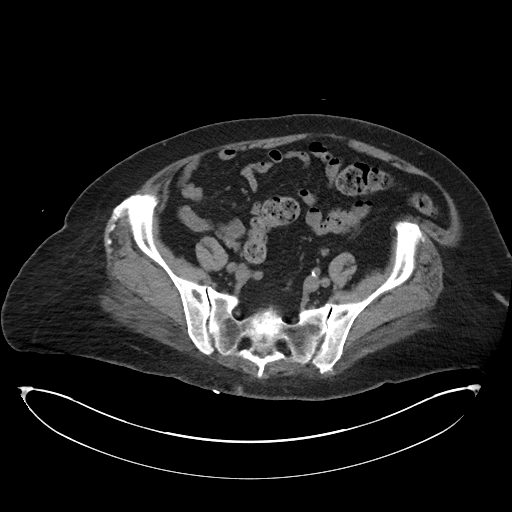
[im 51/102  soft-tissue]
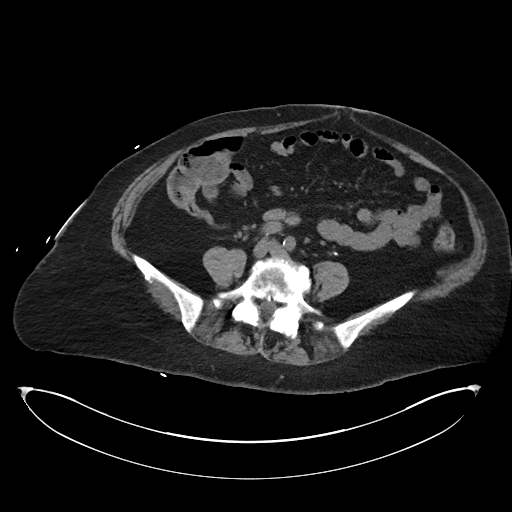
[im 60/102  soft-tissue]
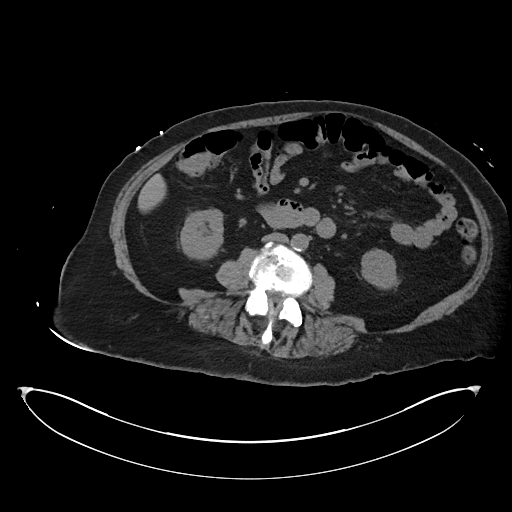
[im 65/102  soft-tissue]
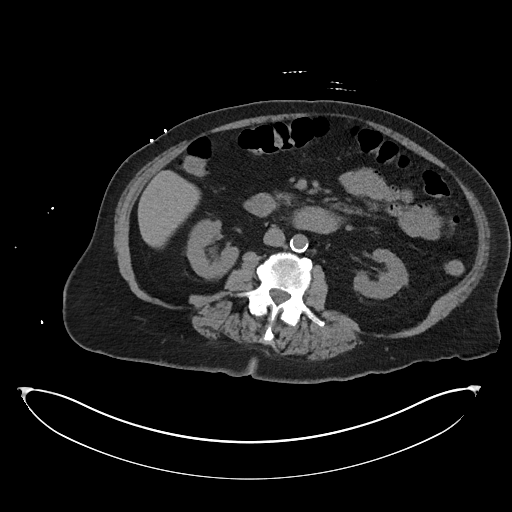
[im 65/102  bone]
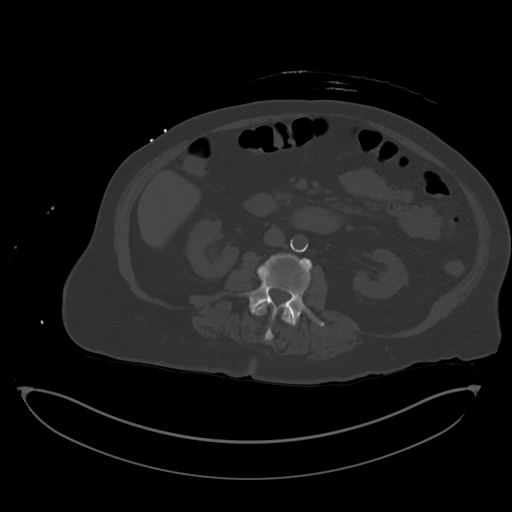
[im 74/102  soft-tissue]
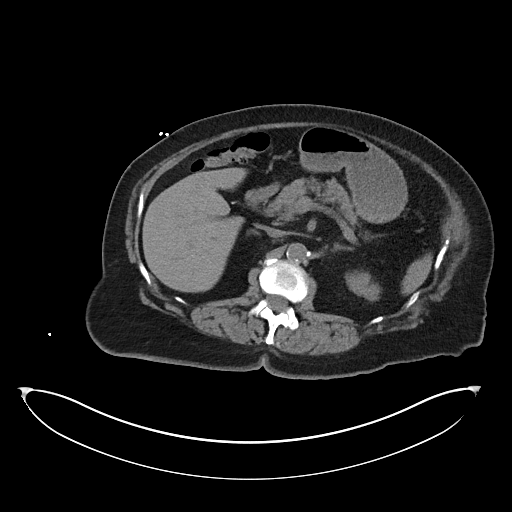
[im 79/102  soft-tissue]
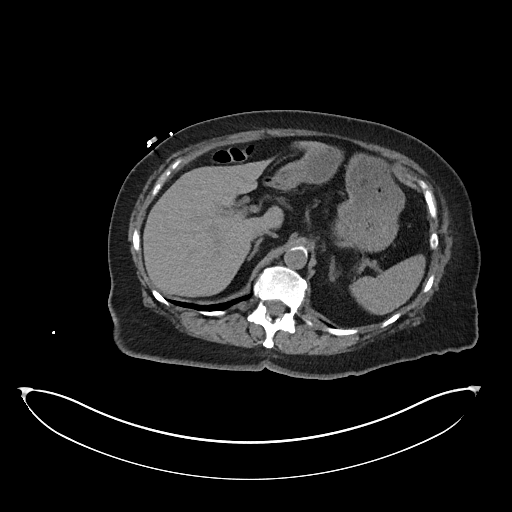
[im 88/102  soft-tissue]
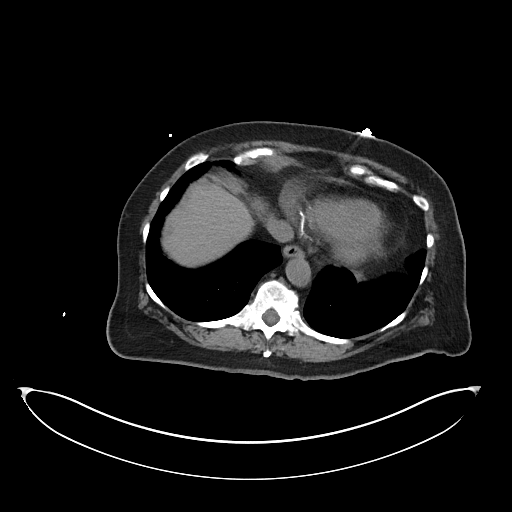
[im 97/102  soft-tissue]
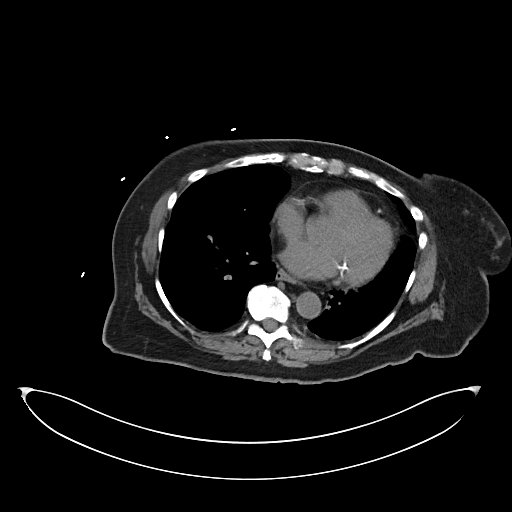

[Series 6: cor st · coronal · 0.97mm/px · 3 of 101 slices shown]
[im 34/101  soft-tissue]
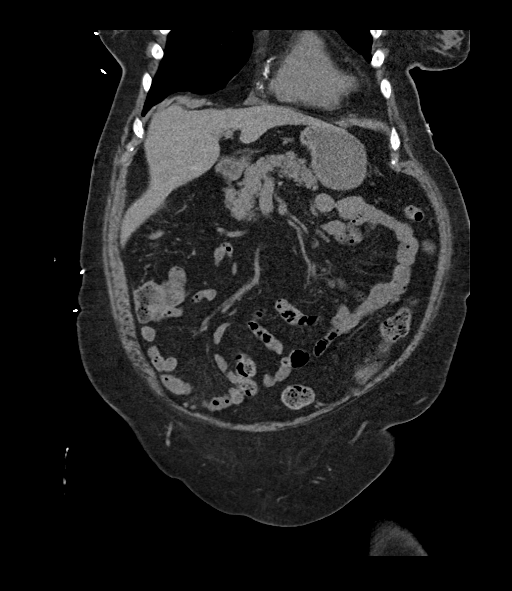
[im 45/101  soft-tissue]
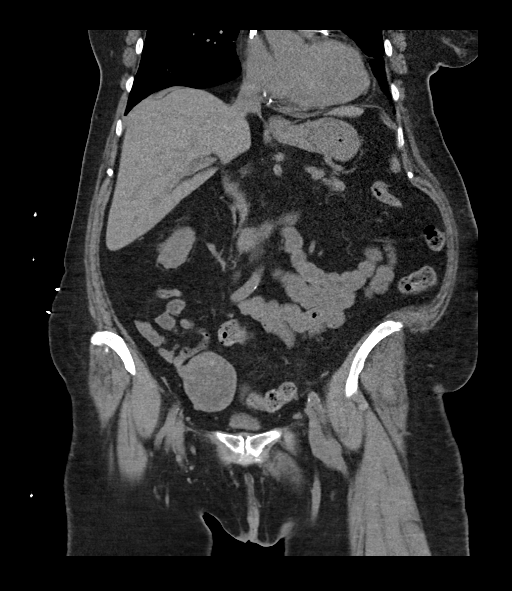
[im 56/101  soft-tissue]
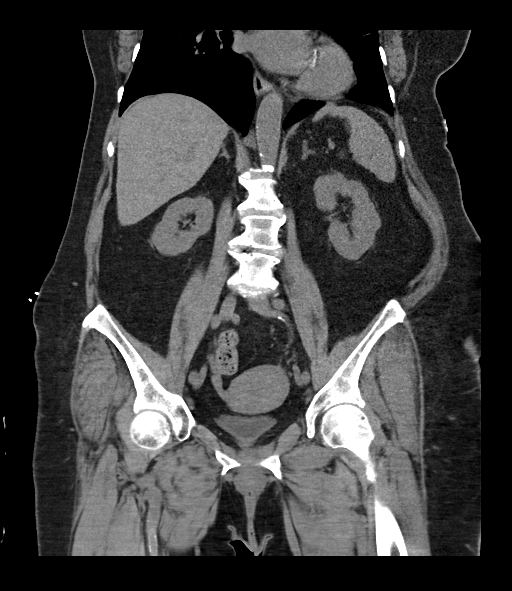

[16 of 46 positions shown; findings below may reference images not displayed]

FINDINGS: Lower chest: No basilar lung consolidation or pleural effusion.
Three-vessel coronary atherosclerosis.

Hepatobiliary: No focal liver abnormality is seen. Status post
cholecystectomy. No biliary dilatation.

Pancreas: Unremarkable.

Spleen: Unremarkable.

Adrenals/Urinary Tract: Unremarkable adrenal glands. Possible 1 mm
calculus in the upper pole of the left kidney (series 6, image 59)
no hydronephrosis. No evidence of a renal mass on this unenhanced
study. Unremarkable ladder.

Stomach/Bowel: The stomach is unremarkable. There is no evidence of
bowel obstruction or inflammation. Mild sigmoid colon diverticulosis
is noted without evidence of diverticulitis. The appendix is
unremarkable.

Vascular/Lymphatic: Abdominal aortic atherosclerosis without
aneurysm. No enlarged lymph nodes.

Reproductive: 6.0 x 5.2 cm intermediate density right adnexal mass
which is new from 2992. This abuts the lateral aspect of the uterine
fundus and likely corresponds to the 6.2 cm mass questioned as an
exophytic fibroid on the 3633 ultrasound. No left adnexal mass.
Mildly bulbous appearance of the upper uterus which may correspond
to the endometrial mass shown on the prior ultrasound but is not
well characterized by this noncontrast CT.

Other: No ascites or pneumoperitoneum.

Musculoskeletal: No suspicious osseous lesion. Moderate lumbar disc
and facet degeneration.
IMPRESSION: 1. No acute abnormality identified in the abdomen or pelvis.
2. Questionable 1 mm nonobstructing left renal calculus.
3. 6 cm right adnexal mass, unchanged in size from a 01/07/2021
pelvic ultrasound. This may reflect an ovarian mass or exophytic
uterine fibroid with the former favored.
4. Poor characterization of the endometrial mass demonstrated on
prior ultrasound.
5. Aortic Atherosclerosis ([id number]).

## 2022-06-20 DEATH — deceased
